# Patient Record
Sex: Female | Born: 1960
Health system: Southern US, Community
[De-identification: ages and names within clinical notes are randomized; demographics above are authoritative.]

## PROBLEM LIST (undated history)

## (undated) DIAGNOSIS — E785 Hyperlipidemia, unspecified: Secondary | ICD-10-CM

## (undated) HISTORY — DX: Hyperlipidemia, unspecified: E78.5

---

## 2003-11-24 HISTORY — PX: BREAST REDUCTION SURGERY: SHX8

## 2018-05-20 ENCOUNTER — Ambulatory Visit: Payer: Self-pay | Admitting: Family Medicine

## 2018-05-20 ENCOUNTER — Encounter: Payer: Self-pay | Admitting: Family Medicine

## 2018-05-20 VITALS — BP 92/62 | HR 67 | Temp 98.6°F | Ht 64.0 in | Wt 133.6 lb

## 2018-05-20 DIAGNOSIS — Z0000 Encounter for general adult medical examination without abnormal findings: Secondary | ICD-10-CM

## 2018-05-20 NOTE — Patient Instructions (Signed)

## 2018-05-20 NOTE — Progress Notes (Signed)
Mary Snyder is a 57 y.o. female who presents today with concerns of a need for an annual physical exam. She removed from [facility] needs PCP had historical episode of chest pain- CTA negative coronary clean per patient. She denies any chronic conditions or acute conditions that need to be addressed today.  Review of Systems  Constitutional: Negative for chills, fever and malaise/fatigue.  HENT: Negative for congestion, ear discharge, ear pain, sinus pain and sore throat.   Eyes: Negative.   Respiratory: Negative for cough, sputum production and shortness of breath.   Cardiovascular: Negative.  Negative for chest pain.  Gastrointestinal: Negative for abdominal pain, diarrhea, nausea and vomiting.  Genitourinary: Negative for dysuria, frequency, hematuria and urgency.  Musculoskeletal: Negative for myalgias.  Skin: Negative.   Neurological: Negative for headaches.  Endo/Heme/Allergies: Negative.   Psychiatric/Behavioral: Negative.     O: Vitals:   05/20/18 1135  BP: 92/62  Pulse: 67  Temp: 98.6 F (37 C)  SpO2: 98%     Physical Exam  Constitutional: She is oriented to person, place, and time. Vital signs are normal. She appears well-developed and well-nourished. She is active.  Non-toxic appearance. She does not have a sickly appearance.  HENT:  Head: Normocephalic.  Right Ear: Hearing, tympanic membrane, external ear and ear canal normal.  Left Ear: Hearing, tympanic membrane, external ear and ear canal normal.  Nose: Nose normal.  Mouth/Throat: Uvula is midline and oropharynx is clear and moist.  Neck: Normal range of motion. Neck supple.  Cardiovascular: Normal rate, regular rhythm, normal heart sounds and normal pulses.  Pulmonary/Chest: Effort normal and breath sounds normal.  Abdominal: Soft. Bowel sounds are normal.  Musculoskeletal: Normal range of motion.  Lymphadenopathy:       Head (right side): No submental and no submandibular adenopathy present.       Head  (left side): No submental and no submandibular adenopathy present.    She has no cervical adenopathy.  Neurological: She is alert and oriented to person, place, and time.  Psychiatric: She has a normal mood and affect. Her speech is normal and behavior is normal. Cognition and memory are normal.  PHQ-9 negative  Vitals reviewed.  A: 1. Physical exam    P: Exam findings, diagnosis etiology and medication use and indications reviewed with patient. Follow- Up and discharge instructions provided. No emergent/urgent issues found on exam.  Patient verbalized understanding of information provided and agrees with plan of care (POC), all questions answered.  1. Physical exam WNL

## 2018-05-25 DIAGNOSIS — H8111 Benign paroxysmal vertigo, right ear: Secondary | ICD-10-CM | POA: Diagnosis not present

## 2019-11-30 ENCOUNTER — Other Ambulatory Visit: Payer: Self-pay

## 2019-11-30 ENCOUNTER — Other Ambulatory Visit: Payer: 59 | Admitting: Internal Medicine

## 2019-11-30 DIAGNOSIS — Z1321 Encounter for screening for nutritional disorder: Secondary | ICD-10-CM | POA: Diagnosis not present

## 2019-11-30 DIAGNOSIS — Z1329 Encounter for screening for other suspected endocrine disorder: Secondary | ICD-10-CM

## 2019-11-30 DIAGNOSIS — Z0000 Encounter for general adult medical examination without abnormal findings: Secondary | ICD-10-CM | POA: Diagnosis not present

## 2019-11-30 DIAGNOSIS — Z13220 Encounter for screening for lipoid disorders: Secondary | ICD-10-CM

## 2019-12-01 LAB — CBC WITH DIFFERENTIAL/PLATELET
Absolute Monocytes: 299 cells/uL (ref 200–950)
Basophils Absolute: 41 cells/uL (ref 0–200)
Basophils Relative: 1 %
Eosinophils Absolute: 62 cells/uL (ref 15–500)
Eosinophils Relative: 1.5 %
HCT: 37.3 % (ref 35.0–45.0)
Hemoglobin: 12.4 g/dL (ref 11.7–15.5)
Lymphs Abs: 1132 cells/uL (ref 850–3900)
MCH: 30.6 pg (ref 27.0–33.0)
MCHC: 33.2 g/dL (ref 32.0–36.0)
MCV: 92.1 fL (ref 80.0–100.0)
MPV: 9.8 fL (ref 7.5–12.5)
Monocytes Relative: 7.3 %
Neutro Abs: 2567 cells/uL (ref 1500–7800)
Neutrophils Relative %: 62.6 %
Platelets: 260 10*3/uL (ref 140–400)
RBC: 4.05 10*6/uL (ref 3.80–5.10)
RDW: 12.2 % (ref 11.0–15.0)
Total Lymphocyte: 27.6 %
WBC: 4.1 10*3/uL (ref 3.8–10.8)

## 2019-12-01 LAB — COMPLETE METABOLIC PANEL WITH GFR
AG Ratio: 1.9 (calc) (ref 1.0–2.5)
ALT: 16 U/L (ref 6–29)
AST: 18 U/L (ref 10–35)
Albumin: 4.2 g/dL (ref 3.6–5.1)
Alkaline phosphatase (APISO): 61 U/L (ref 37–153)
BUN: 10 mg/dL (ref 7–25)
CO2: 28 mmol/L (ref 20–32)
Calcium: 9.5 mg/dL (ref 8.6–10.4)
Chloride: 106 mmol/L (ref 98–110)
Creat: 0.58 mg/dL (ref 0.50–1.05)
GFR, Est African American: 119 mL/min/{1.73_m2} (ref >=60)
GFR, Est Non African American: 102 mL/min/{1.73_m2} (ref >=60)
Globulin: 2.2 g/dL (calc) (ref 1.9–3.7)
Glucose, Bld: 100 mg/dL — ABNORMAL HIGH (ref 65–99)
Potassium: 4.4 mmol/L (ref 3.5–5.3)
Sodium: 140 mmol/L (ref 135–146)
Total Bilirubin: 0.5 mg/dL (ref 0.2–1.2)
Total Protein: 6.4 g/dL (ref 6.1–8.1)

## 2019-12-01 LAB — LIPID PANEL
Cholesterol: 257 mg/dL — ABNORMAL HIGH (ref <200)
HDL: 66 mg/dL (ref >=50)
LDL Cholesterol (Calc): 174 mg/dL (calc) — ABNORMAL HIGH
Non-HDL Cholesterol (Calc): 191 mg/dL (calc) — ABNORMAL HIGH (ref <130)
Total CHOL/HDL Ratio: 3.9 (calc) (ref <5.0)
Triglycerides: 71 mg/dL (ref <150)

## 2019-12-01 LAB — VITAMIN D 25 HYDROXY (VIT D DEFICIENCY, FRACTURES): Vit D, 25-Hydroxy: 17 ng/mL — ABNORMAL LOW (ref 30–100)

## 2019-12-01 LAB — TSH: TSH: 2.35 mIU/L (ref 0.40–4.50)

## 2019-12-05 ENCOUNTER — Ambulatory Visit (INDEPENDENT_AMBULATORY_CARE_PROVIDER_SITE_OTHER): Payer: 59 | Admitting: Internal Medicine

## 2019-12-05 ENCOUNTER — Encounter: Payer: Self-pay | Admitting: Internal Medicine

## 2019-12-05 ENCOUNTER — Other Ambulatory Visit: Payer: Self-pay

## 2019-12-05 VITALS — BP 110/70 | HR 58 | Temp 98.0°F | Ht 64.0 in | Wt 144.0 lb

## 2019-12-05 DIAGNOSIS — R739 Hyperglycemia, unspecified: Secondary | ICD-10-CM | POA: Diagnosis not present

## 2019-12-05 DIAGNOSIS — E7800 Pure hypercholesterolemia, unspecified: Secondary | ICD-10-CM | POA: Diagnosis not present

## 2019-12-05 DIAGNOSIS — Z1231 Encounter for screening mammogram for malignant neoplasm of breast: Secondary | ICD-10-CM | POA: Diagnosis not present

## 2019-12-05 DIAGNOSIS — Z0000 Encounter for general adult medical examination without abnormal findings: Secondary | ICD-10-CM | POA: Diagnosis not present

## 2019-12-05 DIAGNOSIS — E559 Vitamin D deficiency, unspecified: Secondary | ICD-10-CM | POA: Diagnosis not present

## 2019-12-05 DIAGNOSIS — R7302 Impaired glucose tolerance (oral): Secondary | ICD-10-CM

## 2019-12-05 LAB — POCT URINALYSIS DIPSTICK
Appearance: NEGATIVE
Bilirubin, UA: NEGATIVE
Blood, UA: NEGATIVE
Glucose, UA: NEGATIVE
Ketones, UA: NEGATIVE
Leukocytes, UA: NEGATIVE
Nitrite, UA: NEGATIVE
Odor: NEGATIVE
Protein, UA: NEGATIVE
Spec Grav, UA: 1.01 (ref 1.010–1.025)
Urobilinogen, UA: 0.2 E.U./dL
pH, UA: 6.5 (ref 5.0–8.0)

## 2019-12-05 MED ORDER — ROSUVASTATIN CALCIUM 5 MG PO TABS: ORAL_TABLET | ORAL | 1 refills | Status: DC

## 2019-12-05 MED ORDER — VITAMIN D (ERGOCALCIFEROL) 1.25 MG (50000 UNIT) PO CAPS: 50000.0000 [IU] | ORAL_CAPSULE | ORAL | 0 refills | Status: DC

## 2019-12-05 NOTE — Progress Notes (Signed)
   Subjective:    Patient ID: Mary Snyder, female    DOB: 19-Jan-1961, 59 y.o.   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  Patient's general health is excellent.   Had breast reduction surgery in 2005.  2 pregnancies no miscarriages.  No chronic medications.  Had 6 mm polyp removed from ascending colon on screening colonoscopy in December 2015 in [place] where she was living and working at that time.  I am not able to find pathology on care everywhere.    Family history: Father died at age 70 with history of congestive heart failure and heart disease.  Mother died at age 44 "all 7.  1 brother age 54 in good health.  One sister age 58 in good health.  2 sons ages 38 and 82 in good health but one has treated on allergy.  Menarche at 33.  Menopausal in 2016.  History of hyperlipidemia.  Currently not taking any chronic medications.  No known drug allergies.  No history of serious illnesses or accidents.  Social history: Married.  Does not smoke.  Social alcohol consumption.  Review of Systems no chest pain shortness of breath abdominal pain bowel issues or urinary issues.     Objective:   Physical Exam Blood pressure 110/70 pulse 58 temperature 98 degrees orally pulse oximetry 98% weight 144 pounds BMI 24.72.  Skin warm and dry.  Nodes none.  TMs are clear.  Neck is supple without JVD thyromegaly or adenopathy.  Chest clear to auscultation.  Cardiac exam regular rate and rhythm normal S1 and S2 without murmurs or gallops.  Abdomen soft nondistended without hepatosplenomegaly masses or tenderness.  No lower extremity edema or deformity.  Neuro no focal deficits.  Affect and judgment are normal.       Assessment & Plan:  Impaired glucose tolerance-hemoglobin A1c 6.1% continue diet and exercise efforts and follow-up in 6 months  Vitamin D  deficiency-needs high-dose vitamin D 50,000 units weekly for 3 to 6 months  Total cholesterol is 257 with an LDL of 174.  Recommend Crestor 5 mg 3 times a week and follow-up in 3 months.  History of colon polyp on colonoscopy and we need to determine the pathology of that polyp.  Plan: Recommend annual mammogram.  See her again in 3 months.

## 2019-12-08 ENCOUNTER — Other Ambulatory Visit: Payer: 59 | Admitting: Internal Medicine

## 2019-12-15 ENCOUNTER — Other Ambulatory Visit: Payer: 59 | Admitting: Internal Medicine

## 2019-12-15 ENCOUNTER — Other Ambulatory Visit: Payer: Self-pay

## 2019-12-15 DIAGNOSIS — R739 Hyperglycemia, unspecified: Secondary | ICD-10-CM | POA: Diagnosis not present

## 2019-12-16 LAB — HEMOGLOBIN A1C
Hgb A1c MFr Bld: 6.1 % of total Hgb — ABNORMAL HIGH (ref <5.7)
Mean Plasma Glucose: 128 (calc)
eAG (mmol/L): 7.1 (calc)

## 2019-12-24 NOTE — Patient Instructions (Signed)
It was a pleasure to see you today.  Take vitamin D supplement and start Crestor 5 mg 3 times a week.  Follow-up in 3 months.  We need to determine pathology of colon polyp that was removed in [place] in 2015.

## 2019-12-25 ENCOUNTER — Telehealth: Payer: Self-pay | Admitting: Internal Medicine

## 2019-12-25 NOTE — Telephone Encounter (Signed)
Faxed ROI to [employer] on 12/25/2019 for Medical Records, fax number [phone] and [phone]

## 2019-12-27 ENCOUNTER — Other Ambulatory Visit: Payer: Self-pay | Admitting: Internal Medicine

## 2020-03-07 ENCOUNTER — Other Ambulatory Visit: Payer: 59 | Admitting: Internal Medicine

## 2020-03-16 ENCOUNTER — Other Ambulatory Visit: Payer: Self-pay | Admitting: Internal Medicine

## 2020-03-19 ENCOUNTER — Other Ambulatory Visit: Payer: Self-pay

## 2020-03-19 ENCOUNTER — Telehealth: Payer: Self-pay | Admitting: Internal Medicine

## 2020-03-19 DIAGNOSIS — Z1329 Encounter for screening for other suspected endocrine disorder: Secondary | ICD-10-CM

## 2020-03-19 DIAGNOSIS — Z0000 Encounter for general adult medical examination without abnormal findings: Secondary | ICD-10-CM

## 2020-03-19 DIAGNOSIS — E559 Vitamin D deficiency, unspecified: Secondary | ICD-10-CM

## 2020-03-19 DIAGNOSIS — R7302 Impaired glucose tolerance (oral): Secondary | ICD-10-CM

## 2020-03-19 DIAGNOSIS — E7800 Pure hypercholesterolemia, unspecified: Secondary | ICD-10-CM

## 2020-03-19 NOTE — Addendum Note (Signed)
Addended by: Mady Haagensen on: 03/19/2020 12:20 PM   Modules accepted: Orders

## 2020-03-19 NOTE — Telephone Encounter (Signed)
Larna Daughters (Mackynzie's assistant) [phone]  Zigmund Daniel called to see if there was anyway that Pernell could get her fasting labs done at [facility], the lab is very close to her office and this would be more conventiant for her.

## 2020-05-02 ENCOUNTER — Other Ambulatory Visit (HOSPITAL_COMMUNITY)
Admission: RE | Admit: 2020-05-02 | Discharge: 2020-05-02 | Disposition: A | Discharge status: Home / Self Care | Payer: 59 | Source: Ambulatory Visit | Attending: Internal Medicine | Admitting: Internal Medicine

## 2020-05-02 DIAGNOSIS — E7800 Pure hypercholesterolemia, unspecified: Secondary | ICD-10-CM | POA: Insufficient documentation

## 2020-05-02 DIAGNOSIS — Z0000 Encounter for general adult medical examination without abnormal findings: Secondary | ICD-10-CM | POA: Insufficient documentation

## 2020-05-02 DIAGNOSIS — E559 Vitamin D deficiency, unspecified: Secondary | ICD-10-CM | POA: Diagnosis not present

## 2020-05-02 DIAGNOSIS — Z1329 Encounter for screening for other suspected endocrine disorder: Secondary | ICD-10-CM | POA: Insufficient documentation

## 2020-05-02 DIAGNOSIS — R7302 Impaired glucose tolerance (oral): Secondary | ICD-10-CM | POA: Diagnosis not present

## 2020-05-02 LAB — CBC WITH DIFFERENTIAL/PLATELET
Abs Immature Granulocytes: 0.01 10*3/uL (ref 0.00–0.07)
Basophils Absolute: 0 10*3/uL (ref 0.0–0.1)
Basophils Relative: 1 %
Eosinophils Absolute: 0.1 10*3/uL (ref 0.0–0.5)
Eosinophils Relative: 2 %
HCT: 40.3 % (ref 36.0–46.0)
Hemoglobin: 12.9 g/dL (ref 12.0–15.0)
Immature Granulocytes: 0 %
Lymphocytes Relative: 31 %
Lymphs Abs: 1.4 10*3/uL (ref 0.7–4.0)
MCH: 30.3 pg (ref 26.0–34.0)
MCHC: 32 g/dL (ref 30.0–36.0)
MCV: 94.6 fL (ref 80.0–100.0)
Monocytes Absolute: 0.3 10*3/uL (ref 0.1–1.0)
Monocytes Relative: 7 %
Neutro Abs: 2.7 10*3/uL (ref 1.7–7.7)
Neutrophils Relative %: 59 %
Platelets: 244 10*3/uL (ref 150–400)
RBC: 4.26 MIL/uL (ref 3.87–5.11)
RDW: 12.7 % (ref 11.5–15.5)
WBC: 4.5 10*3/uL (ref 4.0–10.5)
nRBC: 0 % (ref 0.0–0.2)

## 2020-05-02 LAB — HEMOGLOBIN A1C
Hgb A1c MFr Bld: 6.2 % — ABNORMAL HIGH (ref 4.8–5.6)
Mean Plasma Glucose: 131.24 mg/dL

## 2020-06-05 ENCOUNTER — Other Ambulatory Visit: Payer: Self-pay | Admitting: Internal Medicine

## 2020-06-05 NOTE — Telephone Encounter (Signed)
Attempted call, Patient was unavaliable

## 2020-06-05 NOTE — Telephone Encounter (Signed)
Please call Mary Snyder. It looks like she never got lipid panel liver functions drawn on Crestor 5 mg 3 times a week. Is she taking It? If so, needs lipid and liver. Would like to recheck Vitamin D level if she has been taking weekly supplement and I would like to see her at an office visit to discuss glucose intolerance. I would prefer she come her and get labs. We put in orders previously  and it never got done.

## 2020-07-04 ENCOUNTER — Other Ambulatory Visit: Payer: Self-pay

## 2020-07-04 ENCOUNTER — Other Ambulatory Visit: Payer: 59 | Admitting: Internal Medicine

## 2020-07-04 DIAGNOSIS — E7800 Pure hypercholesterolemia, unspecified: Secondary | ICD-10-CM

## 2020-07-04 DIAGNOSIS — E559 Vitamin D deficiency, unspecified: Secondary | ICD-10-CM

## 2020-07-04 DIAGNOSIS — R7302 Impaired glucose tolerance (oral): Secondary | ICD-10-CM | POA: Diagnosis not present

## 2020-07-05 ENCOUNTER — Encounter: Payer: Self-pay | Admitting: Internal Medicine

## 2020-07-05 ENCOUNTER — Other Ambulatory Visit: Payer: Self-pay

## 2020-07-05 ENCOUNTER — Ambulatory Visit: Payer: 59 | Admitting: Internal Medicine

## 2020-07-05 ENCOUNTER — Other Ambulatory Visit: Payer: Self-pay | Admitting: Internal Medicine

## 2020-07-05 VITALS — BP 110/80 | HR 55 | Wt 146.0 lb

## 2020-07-05 DIAGNOSIS — E559 Vitamin D deficiency, unspecified: Secondary | ICD-10-CM

## 2020-07-05 DIAGNOSIS — R7302 Impaired glucose tolerance (oral): Secondary | ICD-10-CM | POA: Diagnosis not present

## 2020-07-05 DIAGNOSIS — E7800 Pure hypercholesterolemia, unspecified: Secondary | ICD-10-CM | POA: Diagnosis not present

## 2020-07-05 LAB — LIPID PANEL
Cholesterol: 173 mg/dL (ref <200)
HDL: 61 mg/dL (ref >=50)
LDL Cholesterol (Calc): 99 mg/dL (calc)
Non-HDL Cholesterol (Calc): 112 mg/dL (calc) (ref <130)
Total CHOL/HDL Ratio: 2.8 (calc) (ref <5.0)
Triglycerides: 50 mg/dL (ref <150)

## 2020-07-05 LAB — HEPATIC FUNCTION PANEL
AG Ratio: 2.1 (calc) (ref 1.0–2.5)
ALT: 19 U/L (ref 6–29)
AST: 20 U/L (ref 10–35)
Albumin: 4.5 g/dL (ref 3.6–5.1)
Alkaline phosphatase (APISO): 69 U/L (ref 37–153)
Bilirubin, Direct: 0.1 mg/dL (ref 0.0–0.2)
Globulin: 2.1 g/dL (calc) (ref 1.9–3.7)
Indirect Bilirubin: 0.3 mg/dL (calc) (ref 0.2–1.2)
Total Bilirubin: 0.4 mg/dL (ref 0.2–1.2)
Total Protein: 6.6 g/dL (ref 6.1–8.1)

## 2020-07-05 LAB — HEMOGLOBIN A1C
Hgb A1c MFr Bld: 6.1 % of total Hgb — ABNORMAL HIGH (ref <5.7)
Mean Plasma Glucose: 128 (calc)
eAG (mmol/L): 7.1 (calc)

## 2020-07-05 LAB — VITAMIN D 25 HYDROXY (VIT D DEFICIENCY, FRACTURES): Vit D, 25-Hydroxy: 98 ng/mL (ref 30–100)

## 2020-07-05 MED ORDER — METFORMIN HCL ER 500 MG PO TB24: 500.0000 mg | ORAL_TABLET | Freq: Every day | ORAL | 1 refills | Status: DC

## 2020-07-05 MED ORDER — ROSUVASTATIN CALCIUM 5 MG PO TABS
5.0000 mg | ORAL_TABLET | Freq: Every day | ORAL | 3 refills | Status: DC
Start: 2020-07-05 — End: 2020-07-08

## 2020-07-05 NOTE — Progress Notes (Signed)
   Subjective:    Patient ID: Mary Snyder, female    DOB: 22-Nov-1961, 59 y.o.   MRN: [id number]  HPI 59 year old Female in today to follow-up on hyperlipidemia and impaired glucose tolerance.  She is now on Crestor 5 mg 3 times a week.  Recent lipid panel done on August 12 is completely normal.  Total cholesterol 173, HDL 61, triglycerides 50 and LDL 99.  In January total cholesterol was 257, HDL 66, triglycerides 71 and LDL 174.  In January her hemoglobin A1c was 6.1%.  In June it was 6.2%.  It is now 6.1%.  Her vitamin D level is excellent at 98.  It does not need to be higher.  She would like to stay on high-dose vitamin D weekly for now.  Her vitamin D level was 17 in January 2021.  Continue to hemoglobin A1c still being mildly abnormal, I would like for her to be on Metformin.  She is agreeable to this.  I have prescribed Glucophage XR 500 mg daily with follow-up in 3 months.  In the interim she has made significant lifestyle changes.  She is exercising regularly and watching her diet.    Review of Systems see above no new complaints    Objective:   Physical Exam Blood pressure 110/80 pulse 55 pulse oximetry 99%  Skin warm and dry.  Neck is supple without JVD thyromegaly or carotid bruits.  Chest is clear to auscultation.  Cardiac exam regular rate and rhythm normal S1 and S2.  Extremities without lower extremity edema.        Assessment & Plan:  She looks great and is taking care of herself with diet and exercise.  Vitamin D deficiency-we will continue high-dose vitamin D weekly  Impaired glucose tolerance-not much change in hemoglobin A1c.  Is now 6.1% and she will start Glucophage Exar 500 mg daily and follow-up in 3 months.  Hyperlipidemia-pure hypercholesterolemia.  She is on Crestor 5 mg 3 times a week and lipid panel and liver functions are normal.  Plan: Follow-up in 3 months with regard to elevated hemoglobin A1c at 6.1%.

## 2020-07-08 ENCOUNTER — Other Ambulatory Visit: Payer: Self-pay

## 2020-07-08 MED ORDER — ROSUVASTATIN CALCIUM 5 MG PO TABS: ORAL_TABLET | ORAL | 3 refills | Status: DC

## 2020-07-08 MED FILL — ROSUVASTATIN CALCIUM 5 MG T: 5 | 36 days supply | Qty: 36 | Fill #0

## 2020-07-14 NOTE — Patient Instructions (Signed)
It was a pleasure to see you today.  We are looking for tentorium.  Continue Crestor 5 mg 3 times a week.  Continue high-dose vitamin D supplement weekly.  We are adding Glucophage XR daily for glucose intolerance with follow-up in 3 months.

## 2020-09-08 ENCOUNTER — Other Ambulatory Visit: Payer: Self-pay | Admitting: Internal Medicine

## 2020-10-07 MED FILL — VIT D2 1.25 MG (50,000 UNIT: 1.25 MG | 84 days supply | Qty: 12 | Fill #0

## 2020-10-10 ENCOUNTER — Other Ambulatory Visit: Payer: Self-pay

## 2020-10-10 ENCOUNTER — Other Ambulatory Visit: Payer: 59 | Admitting: Internal Medicine

## 2020-10-10 DIAGNOSIS — R7302 Impaired glucose tolerance (oral): Secondary | ICD-10-CM | POA: Diagnosis not present

## 2020-10-10 DIAGNOSIS — E559 Vitamin D deficiency, unspecified: Secondary | ICD-10-CM

## 2020-10-10 DIAGNOSIS — E7800 Pure hypercholesterolemia, unspecified: Secondary | ICD-10-CM | POA: Diagnosis not present

## 2020-10-10 DIAGNOSIS — R739 Hyperglycemia, unspecified: Secondary | ICD-10-CM | POA: Diagnosis not present

## 2020-10-11 ENCOUNTER — Ambulatory Visit: Payer: 59 | Admitting: Internal Medicine

## 2020-10-11 LAB — HEPATIC FUNCTION PANEL
AG Ratio: 1.7 (calc) (ref 1.0–2.5)
ALT: 20 U/L (ref 6–29)
AST: 23 U/L (ref 10–35)
Albumin: 4.2 g/dL (ref 3.6–5.1)
Alkaline phosphatase (APISO): 65 U/L (ref 37–153)
Bilirubin, Direct: 0.1 mg/dL (ref 0.0–0.2)
Globulin: 2.5 g/dL (calc) (ref 1.9–3.7)
Indirect Bilirubin: 0.3 mg/dL (calc) (ref 0.2–1.2)
Total Bilirubin: 0.4 mg/dL (ref 0.2–1.2)
Total Protein: 6.7 g/dL (ref 6.1–8.1)

## 2020-10-11 LAB — LIPID PANEL
Cholesterol: 194 mg/dL (ref <200)
HDL: 73 mg/dL (ref >=50)
LDL Cholesterol (Calc): 108 mg/dL (calc) — ABNORMAL HIGH
Non-HDL Cholesterol (Calc): 121 mg/dL (calc) (ref <130)
Total CHOL/HDL Ratio: 2.7 (calc) (ref <5.0)
Triglycerides: 51 mg/dL (ref <150)

## 2020-10-11 LAB — VITAMIN D 25 HYDROXY (VIT D DEFICIENCY, FRACTURES): Vit D, 25-Hydroxy: 110 ng/mL — ABNORMAL HIGH (ref 30–100)

## 2020-10-11 LAB — HEMOGLOBIN A1C
Hgb A1c MFr Bld: 6 % of total Hgb — ABNORMAL HIGH (ref <5.7)
Mean Plasma Glucose: 126 (calc)
eAG (mmol/L): 7 (calc)

## 2020-10-11 LAB — MICROALBUMIN / CREATININE URINE RATIO
Creatinine, Urine: 95 mg/dL (ref 20–275)
Microalb Creat Ratio: 5 mcg/mg creat (ref <30)
Microalb, Ur: 0.5 mg/dL

## 2020-11-01 ENCOUNTER — Other Ambulatory Visit: Payer: Self-pay

## 2020-11-01 ENCOUNTER — Ambulatory Visit (INDEPENDENT_AMBULATORY_CARE_PROVIDER_SITE_OTHER): Payer: 59 | Admitting: Internal Medicine

## 2020-11-01 ENCOUNTER — Encounter: Payer: Self-pay | Admitting: Internal Medicine

## 2020-11-01 VITALS — BP 100/70 | HR 74 | Temp 98.6°F | Ht 64.0 in | Wt 119.0 lb

## 2020-11-01 DIAGNOSIS — R059 Cough, unspecified: Secondary | ICD-10-CM | POA: Diagnosis not present

## 2020-11-01 DIAGNOSIS — J22 Unspecified acute lower respiratory infection: Secondary | ICD-10-CM

## 2020-11-01 MED ORDER — AZITHROMYCIN 250 MG PO TABS
ORAL_TABLET | ORAL | 0 refills | Status: DC
Start: 2020-11-01 — End: 2021-06-20

## 2020-11-01 NOTE — Progress Notes (Signed)
   Subjective:    Patient ID: Mary Snyder, female    DOB: May 21, 1961, 59 y.o.   MRN: [id number]  HPI 59 year old Female seen for a cough.  Was in [place] this past weekend and it was very cold.  History of vitamin D deficiency.  History of hyperlipidemia treated with Crestor 5 mg 3 times a week and stable.  Has no fever or shaking chills- just a cough.  Does not COVID-19 exposure other than attending football game in [place].  Has had 2 COVID-19 vaccines.  Review of Systems no fever, chills, dysgeusia, nausea vomiting or myalgias     Objective:   Physical Exam Blood pressure 100/70, pulse 74 regular temperature 98.6 degrees pulse oximetry 97% weight 119 pounds height 5 feet 4 inches  Skin warm and dry: TMs are clear.  Pharynx is clear.  Neck supple.  Chest clear.  No rales or wheezing appreciated.       Assessment & Plan:  Acute lower respiratory infection  History of vitamin D deficiency treated with high-dose vitamin D  History of hyperlipidemia treated with Crestor  Plan: Zithromax Z-PAK 2 p.o. day 1 followed by 1 p.o. days 2 through 5.  Rest and drink plenty of fluids.

## 2020-11-04 LAB — RESPIRATORY VIRUS PANEL

## 2020-12-11 NOTE — Patient Instructions (Signed)
Take Zithromax Z-PAK 2 p.o. day 1 followed by 1 p.o. days 2 through 5.  Rest and drink plenty of fluids.

## 2020-12-25 MED FILL — VIT D2 1.25 MG (50,000 UNIT: 1.25 MG | 84 days supply | Qty: 12 | Fill #0

## 2021-01-24 ENCOUNTER — Other Ambulatory Visit: Payer: 59 | Admitting: Internal Medicine

## 2021-01-24 DIAGNOSIS — Z0000 Encounter for general adult medical examination without abnormal findings: Secondary | ICD-10-CM

## 2021-01-24 DIAGNOSIS — Z1329 Encounter for screening for other suspected endocrine disorder: Secondary | ICD-10-CM

## 2021-01-24 DIAGNOSIS — E559 Vitamin D deficiency, unspecified: Secondary | ICD-10-CM

## 2021-01-24 DIAGNOSIS — R7302 Impaired glucose tolerance (oral): Secondary | ICD-10-CM

## 2021-01-24 DIAGNOSIS — E7800 Pure hypercholesterolemia, unspecified: Secondary | ICD-10-CM

## 2021-02-06 DIAGNOSIS — L814 Other melanin hyperpigmentation: Secondary | ICD-10-CM | POA: Diagnosis not present

## 2021-02-06 DIAGNOSIS — D485 Neoplasm of uncertain behavior of skin: Secondary | ICD-10-CM | POA: Diagnosis not present

## 2021-02-06 DIAGNOSIS — D229 Melanocytic nevi, unspecified: Secondary | ICD-10-CM | POA: Diagnosis not present

## 2021-02-06 DIAGNOSIS — D1801 Hemangioma of skin and subcutaneous tissue: Secondary | ICD-10-CM | POA: Diagnosis not present

## 2021-02-07 ENCOUNTER — Encounter: Payer: 59 | Admitting: Internal Medicine

## 2021-02-13 MED FILL — ROSUVASTATIN CALCIUM 5 MG T: 5 | 84 days supply | Qty: 36 | Fill #0

## 2021-02-20 ENCOUNTER — Other Ambulatory Visit: Payer: Self-pay

## 2021-02-20 ENCOUNTER — Other Ambulatory Visit: Payer: 59 | Admitting: Internal Medicine

## 2021-02-20 DIAGNOSIS — E559 Vitamin D deficiency, unspecified: Secondary | ICD-10-CM | POA: Diagnosis not present

## 2021-02-20 DIAGNOSIS — Z0000 Encounter for general adult medical examination without abnormal findings: Secondary | ICD-10-CM | POA: Diagnosis not present

## 2021-02-20 DIAGNOSIS — Z1329 Encounter for screening for other suspected endocrine disorder: Secondary | ICD-10-CM | POA: Diagnosis not present

## 2021-02-20 DIAGNOSIS — E7800 Pure hypercholesterolemia, unspecified: Secondary | ICD-10-CM | POA: Diagnosis not present

## 2021-02-20 DIAGNOSIS — R7302 Impaired glucose tolerance (oral): Secondary | ICD-10-CM | POA: Diagnosis not present

## 2021-02-21 LAB — COMPLETE METABOLIC PANEL WITH GFR
AG Ratio: 2.1 (calc) (ref 1.0–2.5)
ALT: 23 U/L (ref 6–29)
AST: 23 U/L (ref 10–35)
Albumin: 4.6 g/dL (ref 3.6–5.1)
Alkaline phosphatase (APISO): 67 U/L (ref 37–153)
BUN: 14 mg/dL (ref 7–25)
CO2: 30 mmol/L (ref 20–32)
Calcium: 9.3 mg/dL (ref 8.6–10.4)
Chloride: 107 mmol/L (ref 98–110)
Creat: 0.55 mg/dL (ref 0.50–1.05)
GFR, Est African American: 120 mL/min/{1.73_m2} (ref >=60)
GFR, Est Non African American: 103 mL/min/{1.73_m2} (ref >=60)
Globulin: 2.2 g/dL (calc) (ref 1.9–3.7)
Glucose, Bld: 94 mg/dL (ref 65–99)
Potassium: 4.8 mmol/L (ref 3.5–5.3)
Sodium: 142 mmol/L (ref 135–146)
Total Bilirubin: 0.5 mg/dL (ref 0.2–1.2)
Total Protein: 6.8 g/dL (ref 6.1–8.1)

## 2021-02-21 LAB — HEMOGLOBIN A1C
Hgb A1c MFr Bld: 5.7 % of total Hgb — ABNORMAL HIGH (ref <5.7)
Mean Plasma Glucose: 117 mg/dL
eAG (mmol/L): 6.5 mmol/L

## 2021-02-21 LAB — LIPID PANEL
Cholesterol: 209 mg/dL — ABNORMAL HIGH (ref <200)
HDL: 81 mg/dL (ref >=50)
LDL Cholesterol (Calc): 114 mg/dL (calc) — ABNORMAL HIGH
Non-HDL Cholesterol (Calc): 128 mg/dL (calc) (ref <130)
Total CHOL/HDL Ratio: 2.6 (calc) (ref <5.0)
Triglycerides: 47 mg/dL (ref <150)

## 2021-02-21 LAB — CBC WITH DIFFERENTIAL/PLATELET
Absolute Monocytes: 229 cells/uL (ref 200–950)
Basophils Absolute: 41 cells/uL (ref 0–200)
Basophils Relative: 1.1 %
Eosinophils Absolute: 48 cells/uL (ref 15–500)
Eosinophils Relative: 1.3 %
HCT: 38.9 % (ref 35.0–45.0)
Hemoglobin: 12.6 g/dL (ref 11.7–15.5)
Lymphs Abs: 1262 cells/uL (ref 850–3900)
MCH: 31 pg (ref 27.0–33.0)
MCHC: 32.4 g/dL (ref 32.0–36.0)
MCV: 95.6 fL (ref 80.0–100.0)
MPV: 9.6 fL (ref 7.5–12.5)
Monocytes Relative: 6.2 %
Neutro Abs: 2120 cells/uL (ref 1500–7800)
Neutrophils Relative %: 57.3 %
Platelets: 234 10*3/uL (ref 140–400)
RBC: 4.07 10*6/uL (ref 3.80–5.10)
RDW: 11.9 % (ref 11.0–15.0)
Total Lymphocyte: 34.1 %
WBC: 3.7 10*3/uL — ABNORMAL LOW (ref 3.8–10.8)

## 2021-02-21 LAB — VITAMIN D 25 HYDROXY (VIT D DEFICIENCY, FRACTURES): Vit D, 25-Hydroxy: 84 ng/mL (ref 30–100)

## 2021-02-21 LAB — TSH: TSH: 2.65 mIU/L (ref 0.40–4.50)

## 2021-02-22 ENCOUNTER — Other Ambulatory Visit (HOSPITAL_COMMUNITY): Payer: Self-pay

## 2021-02-25 ENCOUNTER — Encounter: Payer: 59 | Admitting: Internal Medicine

## 2021-03-03 ENCOUNTER — Other Ambulatory Visit: Payer: Self-pay | Admitting: Internal Medicine

## 2021-03-03 ENCOUNTER — Other Ambulatory Visit (HOSPITAL_COMMUNITY): Payer: Self-pay

## 2021-03-03 MED ORDER — VITAMIN D (ERGOCALCIFEROL) 1.25 MG (50000 UNIT) PO CAPS
50000.0000 [IU] | ORAL_CAPSULE | ORAL | 0 refills | Status: DC
  Filled 2021-03-03: qty 12, 84d supply, fill #0

## 2021-03-04 ENCOUNTER — Other Ambulatory Visit (HOSPITAL_COMMUNITY): Payer: Self-pay

## 2021-03-13 ENCOUNTER — Other Ambulatory Visit (HOSPITAL_COMMUNITY): Payer: Self-pay

## 2021-05-22 ENCOUNTER — Other Ambulatory Visit (HOSPITAL_COMMUNITY): Payer: Self-pay

## 2021-05-22 MED ORDER — ERYTHROMYCIN 5 MG/GM OP OINT
1.0000 "application " | TOPICAL_OINTMENT | Freq: Four times a day (QID) | OPHTHALMIC | 0 refills | Status: DC
  Filled 2021-05-22: qty 3.5, 10d supply, fill #0

## 2021-06-20 ENCOUNTER — Encounter: Payer: Self-pay | Admitting: Internal Medicine

## 2021-06-20 ENCOUNTER — Other Ambulatory Visit: Payer: Self-pay

## 2021-06-20 ENCOUNTER — Other Ambulatory Visit (HOSPITAL_COMMUNITY): Payer: Self-pay

## 2021-06-20 ENCOUNTER — Ambulatory Visit (INDEPENDENT_AMBULATORY_CARE_PROVIDER_SITE_OTHER): Payer: 59 | Admitting: Internal Medicine

## 2021-06-20 VITALS — BP 90/60 | HR 60 | Ht 64.0 in | Wt 123.0 lb

## 2021-06-20 DIAGNOSIS — R7302 Impaired glucose tolerance (oral): Secondary | ICD-10-CM

## 2021-06-20 DIAGNOSIS — E7800 Pure hypercholesterolemia, unspecified: Secondary | ICD-10-CM | POA: Diagnosis not present

## 2021-06-20 DIAGNOSIS — Z0000 Encounter for general adult medical examination without abnormal findings: Secondary | ICD-10-CM

## 2021-06-20 DIAGNOSIS — K635 Polyp of colon: Secondary | ICD-10-CM

## 2021-06-20 DIAGNOSIS — Z8639 Personal history of other endocrine, nutritional and metabolic disease: Secondary | ICD-10-CM

## 2021-06-20 LAB — POCT URINALYSIS DIPSTICK
Appearance: NEGATIVE
Bilirubin, UA: NEGATIVE
Blood, UA: NEGATIVE
Glucose, UA: NEGATIVE
Ketones, UA: NEGATIVE
Leukocytes, UA: NEGATIVE
Nitrite, UA: NEGATIVE
Odor: NEGATIVE
Protein, UA: NEGATIVE
Spec Grav, UA: 1.01 (ref 1.010–1.025)
Urobilinogen, UA: 0.2 E.U./dL
pH, UA: 6.5 (ref 5.0–8.0)

## 2021-06-20 MED ORDER — ROSUVASTATIN CALCIUM 5 MG PO TABS
ORAL_TABLET | ORAL | 3 refills | Status: DC
  Filled 2021-06-20 – 2021-09-10 (×3): qty 36, 84d supply, fill #0

## 2021-06-20 MED ORDER — VITAMIN D (ERGOCALCIFEROL) 1.25 MG (50000 UNIT) PO CAPS
50000.0000 [IU] | ORAL_CAPSULE | ORAL | 0 refills | Status: DC
  Filled 2021-06-20 – 2021-09-10 (×3): qty 12, 84d supply, fill #0

## 2021-06-20 NOTE — Progress Notes (Signed)
Subjective:    Patient ID: Mary Snyder, female    DOB: 1961-04-28, 60 y.o.   MRN: [id number]  HPI Pleasant 60 year old Female seen today for health maintenance exam  Patient had screening colonoscopy in 2015 in [place] with 10-year follow-up recommended.  Next colonoscopy due 2025.  Have ordered screening mammogram.  Records indicate 2 COVID vaccinations.  Recommend booster in the Fall.  Tetanus immunization is up-to-date.  Gets annual flu vaccine through employment.  She has a history of impaired glucose tolerance and hemoglobin A1c is now excellent at 5.7%.  Hemoglobin A1c has been as high as 6.2% in June 2021.  She is exercising regularly and watching her diet.  She is not on medication for glucose intolerance.  However, lipids have increased slightly.  Total cholesterol in November was 191 and is now 209.  LDL cholesterol was 108 and is now 114.  In August 2021 her lipids were normal.  In January 2021 total cholesterol was 257 with LDL cholesterol of 174.  Was congratulated on diet and exercise efforts.  She will take Crestor 5 mg 3 times a week.  She will follow-up in February with lipid panel, liver functions and hemoglobin A1C.  History of vitamin D deficiency treated with high-dose vitamin D.  This will be continued.  Social history: She is married.  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  Does not smoke.  Social alcohol consumption.  She has 2 sons.  Menarche at 69.  Menopausal in 2016.  Records from [facility] indicate she has a past history of endometriosis determined by laparoscopy x3.  History of reduction mammoplasty.  Family history: Breast cancer in mother.  Heart disease in brother.  Macular degeneration in father.  Had GYN exam in [place] in 2018. This included Pap smear.  Had colonoscopy in [place] in 2015 with small flat polyp removed which proved to be hyperplastic polyp.Follow up recommended 2025.      Review of  Systems no new complaints     Objective:   Physical Exam Blood pressure 90/60 pulse 60 regular pulse oximetry 97% weight 123 pounds height 5 feet 4 inches BMI 21.11  Skin: Warm and dry.  No cervical adenopathy.  No thyromegaly.  No carotid bruits.  Chest is clear to auscultation.  Cardiac exam: Regular rate and rhythm.  No ectopy appreciated.  Abdomen soft nondistended without hepatosplenomegaly masses or tenderness.  Pelvic exam deferred to GYN physician.  No lower extremity pitting edema.  Neurological exam is intact without focal deficits.  Affect thought and judgment are normal.       Assessment & Plan:  History of hyperplastic polyp on colonoscopy 2015 with 10-year follow-up recommended  History of vitamin D deficiency-now on high-dose vitamin D weekly.  Level was 17 in January 2021  Pure hypercholesterolemia treated with Crestor 5 mg 3 times a week.  Total cholesterol was 209 in March 2022 and was 194 in November 2021.  LDL cholesterol is 114 and was 108 in November 2021.  This is significantly improved from January 2021 when total cholesterol was 257 and LDL cholesterol was 174.  Diet exercise has helped quite a bit as well.  Impaired glucose tolerance-treated with diet.  Hemoglobin A1c has improved from 6.2 in June 20 21-5.7 in March 2022.  She is not on any medication for glucose intolerance and has been exercising and watching her diet.  Plan: We will have her return in 6 months for  lipid panel liver functions and hemoglobin A1c.

## 2021-06-23 ENCOUNTER — Telehealth: Payer: Self-pay | Admitting: Internal Medicine

## 2021-06-23 NOTE — Telephone Encounter (Signed)
[name] [phone]  Shahad called to say that Dr Sabra Heck is not accepting new patients any more, do you recommend anyone else?

## 2021-06-23 NOTE — Telephone Encounter (Signed)
Called patient and gave name and number, put referral in.

## 2021-06-27 ENCOUNTER — Ambulatory Visit: Payer: 59

## 2021-06-27 ENCOUNTER — Other Ambulatory Visit (HOSPITAL_BASED_OUTPATIENT_CLINIC_OR_DEPARTMENT_OTHER): Payer: Self-pay

## 2021-06-30 ENCOUNTER — Other Ambulatory Visit (HOSPITAL_COMMUNITY): Payer: Self-pay

## 2021-07-22 NOTE — Patient Instructions (Addendum)
Please return in 6 months for lipid panel, liver functions and hemoglobin A1c.  Continue high-dose vitamin D weekly.  Continue low-dose Crestor 5 mg 3 times a week.  Glucose intolerance is controlled with diet.  Recommend COVID booster in the Fall.  Mammogram has been ordered.

## 2021-08-15 ENCOUNTER — Other Ambulatory Visit (HOSPITAL_COMMUNITY): Payer: Self-pay

## 2021-08-25 ENCOUNTER — Other Ambulatory Visit (HOSPITAL_COMMUNITY): Payer: Self-pay

## 2021-09-03 ENCOUNTER — Telehealth: Payer: Self-pay

## 2021-09-03 NOTE — Telephone Encounter (Signed)
Patient is scheduled for November 3 at 5 pm.

## 2021-09-03 NOTE — Telephone Encounter (Signed)
-----   Message from Elby Showers, MD sent at 09/02/2021 10:39 AM EDT ----- Please let her know we are extending the order but if she has had a mammogram this year elsewhere then please let us know. ----- Message ----- From: SYSTEM Sent: 09/02/2021  12:16 AM EDT To: Elby Showers, MD

## 2021-09-10 ENCOUNTER — Encounter (HOSPITAL_BASED_OUTPATIENT_CLINIC_OR_DEPARTMENT_OTHER): Payer: Self-pay | Admitting: Obstetrics & Gynecology

## 2021-09-10 ENCOUNTER — Other Ambulatory Visit: Payer: Self-pay

## 2021-09-10 ENCOUNTER — Other Ambulatory Visit (HOSPITAL_COMMUNITY): Payer: Self-pay

## 2021-09-10 ENCOUNTER — Other Ambulatory Visit (HOSPITAL_COMMUNITY)
Admission: RE | Admit: 2021-09-10 | Discharge: 2021-09-10 | Disposition: A | Discharge status: Home / Self Care | Payer: 59 | Source: Ambulatory Visit | Attending: Obstetrics & Gynecology | Admitting: Obstetrics & Gynecology

## 2021-09-10 ENCOUNTER — Ambulatory Visit (INDEPENDENT_AMBULATORY_CARE_PROVIDER_SITE_OTHER): Payer: 59 | Admitting: Obstetrics & Gynecology

## 2021-09-10 VITALS — BP 121/51 | HR 54 | Ht 63.0 in | Wt 126.4 lb

## 2021-09-10 DIAGNOSIS — E785 Hyperlipidemia, unspecified: Secondary | ICD-10-CM

## 2021-09-10 DIAGNOSIS — Z124 Encounter for screening for malignant neoplasm of cervix: Secondary | ICD-10-CM | POA: Insufficient documentation

## 2021-09-10 DIAGNOSIS — Z780 Asymptomatic menopausal state: Secondary | ICD-10-CM

## 2021-09-10 DIAGNOSIS — Z01419 Encounter for gynecological examination (general) (routine) without abnormal findings: Secondary | ICD-10-CM

## 2021-09-10 NOTE — Progress Notes (Signed)
60 y.o. G2P2 Married White or Caucasian female here for annual exam.  PMP.  No HRT.  Employed by [employer].  PCP:  Dr. Renold Genta  No LMP recorded. Patient is postmenopausal.          Sexually active: Yes.    The current method of family planning is status postmenopausal.    Smoker:  no  Health Maintenance: Pap:  12/23/16 neg History of abnormal Pap:  no MMG:  scheduled for 09/2021.   Colonoscopy:  10/27/14 BMD:   discussed with pt Screening Labs: done 01/2021 with Dr. Renold Genta. Reviewed.   reports that she has never smoked. She has never used smokeless tobacco. She reports current alcohol use. She reports that she does not use drugs.  Past Medical History:  Diagnosis Date   Hyperlipidemia     Past Surgical History:  Procedure Laterality Date   BREAST REDUCTION SURGERY  2005    Current Outpatient Medications  Medication Sig Dispense Refill   rosuvastatin (CRESTOR) 5 MG tablet TAKE ONE TABLET BY MOUTH THREE TIMES A WEEK 36 tablet 3   Vitamin D, Ergocalciferol, (DRISDOL) 1.25 MG (50000 UNIT) CAPS capsule Take 1 capsule (50,000 Units total) by mouth once every 7 days 12 capsule 0   No current facility-administered medications for this visit.    Family History  Problem Relation Age of Onset   Hypertension Father    Heart attack Father    Arthritis Mother    Hypertension Mother    Heart attack Brother    Kidney Stones Brother     Review of Systems  All other systems reviewed and are negative.  Exam:   BP (!) 121/51   Pulse (!) 54   Ht 5\' 3"  (1.6 m)   Wt 126 lb 6.4 oz (57.3 kg)   BMI 22.39 kg/m   Height: 5\' 3"  (160 cm)  General appearance: alert, cooperative and appears stated age Head: Normocephalic, without obvious abnormality, atraumatic Neck: no adenopathy, supple, symmetrical, trachea midline and thyroid normal to inspection and palpation Lungs: clear to auscultation bilaterally Breasts: normal appearance, no masses or tenderness Heart: regular rate and  rhythm Abdomen: soft, non-tender; bowel sounds normal; no masses,  no organomegaly Extremities: extremities normal, atraumatic, no cyanosis or edema Skin: Skin color, texture, turgor normal. No rashes or lesions Lymph nodes: Cervical, supraclavicular, and axillary nodes normal. No abnormal inguinal nodes palpated Neurologic: Grossly normal  Pelvic: External genitalia:  no lesions              Urethra:  normal appearing urethra with no masses, tenderness or lesions              Bartholins and Skenes: normal                 Vagina: normal appearing vagina with normal color and no discharge, no lesions              Cervix: no lesions              Pap taken: Yes.   Bimanual Exam:  Uterus:  normal size, contour, position, consistency, mobility, non-tender              Adnexa: normal adnexa and no mass, fullness, tenderness               Rectovaginal: Confirms               Anus:  normal sphincter tone, no lesions  Chaperone, Octaviano Batty, CMA, was present for exam.  Assessment/Plan: 1. Well woman exam with routine gynecological exam - pap and HR HPV obtained todayPap:  12/23/16 neg - MMG scheduled 09/2021 - colonoscopy 10/27/2014.  In Care Everywhere.  Follow up 10 years. - plan BMD after next birthday - lab work done with Dr. Renold Genta - Care Gaps reviewed and updated  2. Postmenopausal - no HRT  3. Hyperlipidemia, unspecified hyperlipidemia type - on Crestor

## 2021-09-12 LAB — CYTOLOGY - PAP
Comment: NEGATIVE
Diagnosis: NEGATIVE
High risk HPV: NEGATIVE

## 2021-09-13 DIAGNOSIS — E785 Hyperlipidemia, unspecified: Secondary | ICD-10-CM | POA: Insufficient documentation

## 2021-09-13 DIAGNOSIS — Z780 Asymptomatic menopausal state: Secondary | ICD-10-CM | POA: Insufficient documentation

## 2021-09-25 ENCOUNTER — Ambulatory Visit
Admission: RE | Admit: 2021-09-25 | Discharge: 2021-09-25 | Disposition: A | Discharge status: Home / Self Care | Payer: 59 | Source: Ambulatory Visit | Attending: Internal Medicine | Admitting: Internal Medicine

## 2021-09-25 DIAGNOSIS — Z1231 Encounter for screening mammogram for malignant neoplasm of breast: Secondary | ICD-10-CM | POA: Diagnosis not present

## 2021-12-19 ENCOUNTER — Other Ambulatory Visit: Payer: Self-pay

## 2021-12-19 ENCOUNTER — Other Ambulatory Visit: Payer: 59 | Admitting: Internal Medicine

## 2021-12-19 DIAGNOSIS — R7302 Impaired glucose tolerance (oral): Secondary | ICD-10-CM | POA: Diagnosis not present

## 2021-12-19 DIAGNOSIS — E7800 Pure hypercholesterolemia, unspecified: Secondary | ICD-10-CM

## 2021-12-20 LAB — LIPID PANEL
Cholesterol: 222 mg/dL — ABNORMAL HIGH (ref <200)
HDL: 71 mg/dL (ref >=50)
LDL Cholesterol (Calc): 137 mg/dL (calc) — ABNORMAL HIGH
Non-HDL Cholesterol (Calc): 151 mg/dL (calc) — ABNORMAL HIGH (ref <130)
Total CHOL/HDL Ratio: 3.1 (calc) (ref <5.0)
Triglycerides: 52 mg/dL (ref <150)

## 2021-12-20 LAB — HEMOGLOBIN A1C
Hgb A1c MFr Bld: 6 % of total Hgb — ABNORMAL HIGH (ref <5.7)
Mean Plasma Glucose: 126 mg/dL
eAG (mmol/L): 7 mmol/L

## 2021-12-20 LAB — HEPATIC FUNCTION PANEL
AG Ratio: 1.9 (calc) (ref 1.0–2.5)
ALT: 19 U/L (ref 6–29)
AST: 20 U/L (ref 10–35)
Albumin: 4.2 g/dL (ref 3.6–5.1)
Alkaline phosphatase (APISO): 63 U/L (ref 37–153)
Bilirubin, Direct: 0.1 mg/dL (ref 0.0–0.2)
Globulin: 2.2 g/dL (calc) (ref 1.9–3.7)
Indirect Bilirubin: 0.3 mg/dL (calc) (ref 0.2–1.2)
Total Bilirubin: 0.4 mg/dL (ref 0.2–1.2)
Total Protein: 6.4 g/dL (ref 6.1–8.1)

## 2021-12-26 ENCOUNTER — Other Ambulatory Visit (HOSPITAL_COMMUNITY): Payer: Self-pay

## 2021-12-26 ENCOUNTER — Other Ambulatory Visit: Payer: Self-pay

## 2021-12-26 ENCOUNTER — Ambulatory Visit: Payer: 59 | Admitting: Internal Medicine

## 2021-12-26 ENCOUNTER — Encounter: Payer: Self-pay | Admitting: Internal Medicine

## 2021-12-26 VITALS — BP 100/60 | HR 63 | Temp 97.4°F | Ht 63.0 in | Wt 128.5 lb

## 2021-12-26 DIAGNOSIS — E7800 Pure hypercholesterolemia, unspecified: Secondary | ICD-10-CM

## 2021-12-26 DIAGNOSIS — Z8639 Personal history of other endocrine, nutritional and metabolic disease: Secondary | ICD-10-CM

## 2021-12-26 DIAGNOSIS — M7918 Myalgia, other site: Secondary | ICD-10-CM | POA: Diagnosis not present

## 2021-12-26 DIAGNOSIS — R7302 Impaired glucose tolerance (oral): Secondary | ICD-10-CM | POA: Diagnosis not present

## 2021-12-26 MED ORDER — MELOXICAM 15 MG PO TABS
15.0000 mg | ORAL_TABLET | Freq: Every day | ORAL | 0 refills | Status: DC
  Filled 2021-12-26: qty 30, 30d supply, fill #0

## 2021-12-26 MED ORDER — ROSUVASTATIN CALCIUM 10 MG PO TABS
10.0000 mg | ORAL_TABLET | Freq: Every day | ORAL | 1 refills | Status: DC
  Filled 2021-12-26: qty 90, 90d supply, fill #0
  Filled 2022-12-13 – 2022-12-22 (×2): qty 90, 90d supply, fill #1

## 2021-12-26 NOTE — Progress Notes (Signed)
° °  Subjective:    Patient ID: Mary Snyder, female    DOB: 1960/12/25, 61 y.o.   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  Lipid panel on Crestor 5 mg 3 times a week has not shown significant improvement.  Total cholesterol in March was 209 and is now 222.  LDL cholesterol 114 and is now 137.  She will begin Crestor 10 mg daily and follow-up in 3 months.  She is on high-dose vitamin D weekly for history of vitamin D deficiency.  This will be continued.  She is having some musculoskeletal pain with working out and Mobic 15 mg daily has been prescribed.  Otherwise she feels well.    Review of Systems no new complaints     Objective:   Physical Exam Blood pressure excellent 100/60 pulse 63 pulse oximetry 97% weight 128 pounds 8 ounces.  BMI 22.76 weight 128 pounds 8 ounces (gained 5.5 pounds since July 2022 but this is just after the holidays) Skin warm and dry.  No cervical adenopathy.  Chest clear.  Cardiac exam regular rate and rhythm.  No lower extremity edema.      Assessment & Plan:   Impaired glucose tolerance-probably would benefit from Endocrinology referral as hemoglobin A1c is 6% and was 5.7% in March 2022.  Was also running around 6 to 6.2% in 2021.  She would probably welcome being on Wegovy if that is appropriate for her.  History of vitamin D deficiency-continue high-dose vitamin D weekly  Hyperlipidemia increase Crestor to 10 mg daily and follow-up in 3 months.  CPE will be due at that time.  Musculoskeletal pain-meloxicam prescribed 15 mg daily as needed.

## 2021-12-31 ENCOUNTER — Other Ambulatory Visit (HOSPITAL_COMMUNITY): Payer: Self-pay

## 2022-01-11 ENCOUNTER — Encounter: Payer: Self-pay | Admitting: Internal Medicine

## 2022-01-11 NOTE — Patient Instructions (Addendum)
Continue high-dose vitamin D weekly.  Change Crestor to 10 mg daily and follow-up in May.  Consider Endocrine referral for elevated hemoglobin A1c which is mild.  Meloxicam prescribed for musculoskeletal pain.

## 2022-01-28 ENCOUNTER — Telehealth: Payer: Self-pay | Admitting: Internal Medicine

## 2022-01-28 NOTE — Telephone Encounter (Signed)
?  Theresia Majors ?[phone] ? ?Mary Snyder called to say she has COVID, started getting sick on Monday, sore throat, runny nose, cough, body aches's and head ache. She stated feel like really bad flu. I scheduled for Video visit tomorrow at 11:45. ? ?She has no blood pressure or thermometer in her home will try to get at least a thermometer before visit. ? ?

## 2022-01-29 ENCOUNTER — Telehealth: Payer: 59 | Admitting: Internal Medicine

## 2022-01-29 ENCOUNTER — Other Ambulatory Visit: Payer: Self-pay

## 2022-01-29 ENCOUNTER — Other Ambulatory Visit (HOSPITAL_COMMUNITY): Payer: Self-pay

## 2022-01-29 ENCOUNTER — Encounter: Payer: Self-pay | Admitting: Internal Medicine

## 2022-01-29 ENCOUNTER — Ambulatory Visit: Payer: 59 | Admitting: Internal Medicine

## 2022-01-29 VITALS — Temp 99.0°F

## 2022-01-29 DIAGNOSIS — U071 COVID-19: Secondary | ICD-10-CM

## 2022-01-29 MED ORDER — BENZONATATE 100 MG PO CAPS
100.0000 mg | ORAL_CAPSULE | Freq: Three times a day (TID) | ORAL | 0 refills | Status: DC | PRN
  Filled 2022-01-29: qty 30, 10d supply, fill #0

## 2022-01-29 MED ORDER — NIRMATRELVIR&RITONAVIR 300/100 20 X 150 MG & 10 X 100MG PO TBPK
3.0000 | ORAL_TABLET | Freq: Two times a day (BID) | ORAL | 0 refills | Status: AC
  Filled 2022-01-29: qty 30, 5d supply, fill #0

## 2022-01-29 NOTE — Progress Notes (Signed)
? ?  Subjective:  ? ? Patient ID: Mary Snyder, female    DOB: 07/20/1961, 61 y.o.   MRN: [id number] ? ?HPI 61 year old Female seen by interactive audio and video telecommunications due to the Coronavirus pandemic.  Patient is agreeable to visit in this format today.  She is identified using 2 identifiers as Mary Eaker. Snyder, a patient in this practice.  She is at her home and I am at the office. ? ?Patient says that she began to have runny nose and sinus pressure on Monday evening March 6.  She felt like she was coming down with a cold.  Symptoms got worse on Tuesday and Wednesday.  She took a home COVID test on Wednesday, March 8 and it was positive.  She has been taking over-the-counter decongestant.  Has developed productive cough.  Has myalgias and headache.  Has malaise and fatigue.  He is not short of breath.  No nausea. ? ?Patient has been vaccinated for COVID-19.  Last vaccine we have on file is February 2022. ? ?Patient has a history of impaired glucose tolerance treated with diet.  History of pure hypercholesterolemia treated with Crestor 10 mg daily.  History of vitamin D deficiency in 2021 which was corrected with high-dose vitamin D supplementation. ? ? ? ?Review of Systems denies nausea and vomiting.  No shortness of breath.  No chest pain. ? ?   ?Objective:  ? Physical Exam ? ?Seen virtually and looks fatigued.  Does not appear to be in respiratory distress and is not hoarse. ? ? ?   ?Assessment & Plan:  ?Acute COVID-19 virus infection ? ?Plan: Patient has been prescribed Paxlovid regular strength to take as directed.  Have also prescribed Tessalon Perles 100 mg to take up to 3 times daily as needed for cough. She is to rest at home and quarantine for minimum of 5 days.  She is to stay well-hydrated and is to walk some to prevent atelectasis.  Suggest monitoring oxygen level with pulse oximetry.  She will call if symptoms worsen.  We will check in with her tomorrow. ? ?Time spent with this visit  including chart review and interviewing patient as well as E scribing medication is 20 minutes ? ?

## 2022-01-29 NOTE — Patient Instructions (Signed)
Stay well-hydrated by consuming fluids.  Monitor pulse oximetry and walk some around the house to prevent atelectasis.  You have been prescribed Paxlovid regular strength to take as directed for acute COVID-19 virus infection.  Have also prescribed Tessalon Perles to take up to 3 times daily if needed for cough.  We will check in with you by telephone tomorrow. ?

## 2022-01-30 NOTE — Telephone Encounter (Signed)
Mary Snyder called today to say she is feeling better. She got her medication yesterday and started it. So far no one else in the family has gotten COVID from her. ?

## 2022-03-27 ENCOUNTER — Other Ambulatory Visit: Payer: 59

## 2022-03-27 DIAGNOSIS — R7302 Impaired glucose tolerance (oral): Secondary | ICD-10-CM

## 2022-03-27 DIAGNOSIS — E785 Hyperlipidemia, unspecified: Secondary | ICD-10-CM

## 2022-03-28 ENCOUNTER — Other Ambulatory Visit: Payer: Self-pay

## 2022-03-28 ENCOUNTER — Emergency Department (HOSPITAL_BASED_OUTPATIENT_CLINIC_OR_DEPARTMENT_OTHER)
Admission: EM | Admit: 2022-03-28 | Discharge: 2022-03-28 | Disposition: A | Discharge status: Home / Self Care | Payer: 59 | Attending: Emergency Medicine | Admitting: Emergency Medicine

## 2022-03-28 DIAGNOSIS — Y92410 Unspecified street and highway as the place of occurrence of the external cause: Secondary | ICD-10-CM | POA: Diagnosis not present

## 2022-03-28 DIAGNOSIS — S0990XA Unspecified injury of head, initial encounter: Secondary | ICD-10-CM | POA: Diagnosis not present

## 2022-03-28 DIAGNOSIS — R519 Headache, unspecified: Secondary | ICD-10-CM | POA: Diagnosis not present

## 2022-03-28 LAB — LIPID PANEL
Cholesterol: 203 mg/dL — ABNORMAL HIGH (ref <200)
HDL: 80 mg/dL (ref >=50)
LDL Cholesterol (Calc): 113 mg/dL (calc) — ABNORMAL HIGH
Non-HDL Cholesterol (Calc): 123 mg/dL (calc) (ref <130)
Total CHOL/HDL Ratio: 2.5 (calc) (ref <5.0)
Triglycerides: 35 mg/dL (ref <150)

## 2022-03-28 LAB — HEPATIC FUNCTION PANEL
AG Ratio: 2 (calc) (ref 1.0–2.5)
ALT: 21 U/L (ref 6–29)
AST: 21 U/L (ref 10–35)
Albumin: 4.5 g/dL (ref 3.6–5.1)
Alkaline phosphatase (APISO): 70 U/L (ref 37–153)
Bilirubin, Direct: 0.1 mg/dL (ref 0.0–0.2)
Globulin: 2.2 g/dL (calc) (ref 1.9–3.7)
Indirect Bilirubin: 0.3 mg/dL (calc) (ref 0.2–1.2)
Total Bilirubin: 0.4 mg/dL (ref 0.2–1.2)
Total Protein: 6.7 g/dL (ref 6.1–8.1)

## 2022-03-28 LAB — HEMOGLOBIN A1C
Hgb A1c MFr Bld: 5.8 % of total Hgb — ABNORMAL HIGH (ref <5.7)
Mean Plasma Glucose: 120 mg/dL
eAG (mmol/L): 6.6 mmol/L

## 2022-03-28 MED ORDER — IBUPROFEN 400 MG PO TABS
600.0000 mg | ORAL_TABLET | Freq: Once | ORAL | Status: AC
  Administered 2022-03-28: 600 mg via ORAL
  Filled 2022-03-28: qty 1
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
## 2022-06-19 ENCOUNTER — Other Ambulatory Visit: Payer: 59

## 2022-06-19 DIAGNOSIS — Z0000 Encounter for general adult medical examination without abnormal findings: Secondary | ICD-10-CM

## 2022-06-19 DIAGNOSIS — E7800 Pure hypercholesterolemia, unspecified: Secondary | ICD-10-CM

## 2022-06-19 DIAGNOSIS — R5383 Other fatigue: Secondary | ICD-10-CM

## 2022-06-19 DIAGNOSIS — E559 Vitamin D deficiency, unspecified: Secondary | ICD-10-CM

## 2022-06-19 DIAGNOSIS — R7302 Impaired glucose tolerance (oral): Secondary | ICD-10-CM

## 2022-06-25 ENCOUNTER — Other Ambulatory Visit: Payer: 59

## 2022-06-26 ENCOUNTER — Ambulatory Visit (INDEPENDENT_AMBULATORY_CARE_PROVIDER_SITE_OTHER): Payer: 59 | Admitting: Internal Medicine

## 2022-06-26 ENCOUNTER — Encounter: Payer: Self-pay | Admitting: Internal Medicine

## 2022-06-26 ENCOUNTER — Other Ambulatory Visit: Payer: 59

## 2022-06-26 VITALS — BP 114/76 | HR 54 | Temp 98.0°F | Ht 63.5 in | Wt 128.8 lb

## 2022-06-26 DIAGNOSIS — R82998 Other abnormal findings in urine: Secondary | ICD-10-CM | POA: Diagnosis not present

## 2022-06-26 DIAGNOSIS — Z0000 Encounter for general adult medical examination without abnormal findings: Secondary | ICD-10-CM

## 2022-06-26 DIAGNOSIS — E559 Vitamin D deficiency, unspecified: Secondary | ICD-10-CM | POA: Diagnosis not present

## 2022-06-26 DIAGNOSIS — R5383 Other fatigue: Secondary | ICD-10-CM | POA: Diagnosis not present

## 2022-06-26 DIAGNOSIS — Z780 Asymptomatic menopausal state: Secondary | ICD-10-CM

## 2022-06-26 DIAGNOSIS — E785 Hyperlipidemia, unspecified: Secondary | ICD-10-CM | POA: Diagnosis not present

## 2022-06-26 LAB — POCT URINALYSIS DIPSTICK
Bilirubin, UA: NEGATIVE
Blood, UA: NEGATIVE
Glucose, UA: NEGATIVE
Ketones, UA: NEGATIVE
Nitrite, UA: NEGATIVE
Protein, UA: NEGATIVE
Spec Grav, UA: 1.01 (ref 1.010–1.025)
Urobilinogen, UA: 0.2 E.U./dL
pH, UA: 7.5 (ref 5.0–8.0)

## 2022-06-26 NOTE — Patient Instructions (Addendum)
Bone density ordered and referral to orthopedist for right shoulder pain that has persisted for a few months. Vitamin D level added.  Endocrinology referral will be placed for weight loss consultation with new medications and management of impaired glucose tolerance.
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
## 2022-06-27 LAB — CBC WITH DIFFERENTIAL/PLATELET
Absolute Monocytes: 238 cells/uL (ref 200–950)
Basophils Absolute: 29 cells/uL (ref 0–200)
Basophils Relative: 0.7 %
Eosinophils Absolute: 41 cells/uL (ref 15–500)
Eosinophils Relative: 1 %
HCT: 39.4 % (ref 35.0–45.0)
Hemoglobin: 13.1 g/dL (ref 11.7–15.5)
Lymphs Abs: 1152 cells/uL (ref 850–3900)
MCH: 30.7 pg (ref 27.0–33.0)
MCHC: 33.2 g/dL (ref 32.0–36.0)
MCV: 92.3 fL (ref 80.0–100.0)
MPV: 10.1 fL (ref 7.5–12.5)
Monocytes Relative: 5.8 %
Neutro Abs: 2640 cells/uL (ref 1500–7800)
Neutrophils Relative %: 64.4 %
Platelets: 232 10*3/uL (ref 140–400)
RBC: 4.27 10*6/uL (ref 3.80–5.10)
RDW: 11.8 % (ref 11.0–15.0)
Total Lymphocyte: 28.1 %
WBC: 4.1 10*3/uL (ref 3.8–10.8)

## 2022-06-27 LAB — COMPLETE METABOLIC PANEL WITH GFR
AG Ratio: 2 (calc) (ref 1.0–2.5)
ALT: 19 U/L (ref 6–29)
AST: 20 U/L (ref 10–35)
Albumin: 4.5 g/dL (ref 3.6–5.1)
Alkaline phosphatase (APISO): 64 U/L (ref 37–153)
BUN: 15 mg/dL (ref 7–25)
CO2: 30 mmol/L (ref 20–32)
Calcium: 9.4 mg/dL (ref 8.6–10.4)
Chloride: 106 mmol/L (ref 98–110)
Creat: 0.53 mg/dL (ref 0.50–1.03)
Globulin: 2.3 g/dL (calc) (ref 1.9–3.7)
Glucose, Bld: 89 mg/dL (ref 65–99)
Potassium: 4.2 mmol/L (ref 3.5–5.3)
Sodium: 142 mmol/L (ref 135–146)
Total Bilirubin: 0.4 mg/dL (ref 0.2–1.2)
Total Protein: 6.8 g/dL (ref 6.1–8.1)
eGFR: 106 mL/min/{1.73_m2} (ref >=60)

## 2022-06-27 LAB — LIPID PANEL
Cholesterol: 201 mg/dL — ABNORMAL HIGH (ref <200)
HDL: 77 mg/dL (ref >=50)
LDL Cholesterol (Calc): 109 mg/dL (calc) — ABNORMAL HIGH
Non-HDL Cholesterol (Calc): 124 mg/dL (calc) (ref <130)
Total CHOL/HDL Ratio: 2.6 (calc) (ref <5.0)
Triglycerides: 63 mg/dL (ref <150)

## 2022-06-27 LAB — URINE CULTURE
MICRO NUMBER:: 13737519
Result:: NO GROWTH
SPECIMEN QUALITY:: ADEQUATE

## 2022-06-27 LAB — TSH: TSH: 2.9 mIU/L (ref 0.40–4.50)

## 2022-06-29 ENCOUNTER — Other Ambulatory Visit (HOSPITAL_COMMUNITY): Payer: Self-pay

## 2022-06-29 ENCOUNTER — Other Ambulatory Visit: Payer: Self-pay

## 2022-06-29 MED ORDER — ERGOCALCIFEROL 1.25 MG (50000 UT) PO CAPS
50000.0000 [IU] | ORAL_CAPSULE | ORAL | 3 refills | Status: DC
  Filled 2022-06-29: qty 12, 84d supply, fill #0
  Filled 2022-09-15: qty 12, 84d supply, fill #1
  Filled 2022-12-13 – 2022-12-22 (×2): qty 12, 84d supply, fill #2
  Filled 2023-03-15: qty 12, 84d supply, fill #3

## 2022-06-30 LAB — LIPOPROTEIN A (LPA): Lipoprotein (a): 30 nmol/L (ref <75)

## 2022-06-30 LAB — VITAMIN D 25 HYDROXY (VIT D DEFICIENCY, FRACTURES): Vit D, 25-Hydroxy: 30 ng/mL (ref 30–100)

## 2022-08-20 ENCOUNTER — Ambulatory Visit (HOSPITAL_BASED_OUTPATIENT_CLINIC_OR_DEPARTMENT_OTHER)
Admission: RE | Admit: 2022-08-20 | Discharge: 2022-08-20 | Disposition: A | Discharge status: Home / Self Care | Payer: 59 | Source: Ambulatory Visit | Attending: Internal Medicine | Admitting: Internal Medicine

## 2022-08-20 DIAGNOSIS — Z780 Asymptomatic menopausal state: Secondary | ICD-10-CM | POA: Insufficient documentation

## 2022-08-20 DIAGNOSIS — M8589 Other specified disorders of bone density and structure, multiple sites: Secondary | ICD-10-CM | POA: Diagnosis not present

## 2022-08-20 DIAGNOSIS — Z13820 Encounter for screening for osteoporosis: Secondary | ICD-10-CM | POA: Insufficient documentation

## 2022-08-21 DIAGNOSIS — S4351XA Sprain of right acromioclavicular joint, initial encounter: Secondary | ICD-10-CM | POA: Diagnosis not present

## 2022-09-15 ENCOUNTER — Other Ambulatory Visit (HOSPITAL_COMMUNITY): Payer: Self-pay

## 2022-09-17 ENCOUNTER — Ambulatory Visit (INDEPENDENT_AMBULATORY_CARE_PROVIDER_SITE_OTHER): Payer: 59 | Admitting: Obstetrics & Gynecology

## 2022-09-17 ENCOUNTER — Encounter (HOSPITAL_BASED_OUTPATIENT_CLINIC_OR_DEPARTMENT_OTHER): Payer: Self-pay | Admitting: Obstetrics & Gynecology

## 2022-09-17 VITALS — BP 113/45 | HR 58 | Ht 64.0 in | Wt 126.8 lb

## 2022-09-17 DIAGNOSIS — Z780 Asymptomatic menopausal state: Secondary | ICD-10-CM

## 2022-09-17 DIAGNOSIS — Z01419 Encounter for gynecological examination (general) (routine) without abnormal findings: Secondary | ICD-10-CM | POA: Diagnosis not present

## 2022-09-17 DIAGNOSIS — Z1231 Encounter for screening mammogram for malignant neoplasm of breast: Secondary | ICD-10-CM | POA: Diagnosis not present

## 2022-09-17 NOTE — Patient Instructions (Signed)
OsCal or Caltrate '600mg'$  daily

## 2022-09-17 NOTE — Progress Notes (Signed)
61 y.o. G2P2 Married White or Caucasian female here for annual exam.  Doing well.  Denies vaginal bleeding.    Had a BMD in September.  She has osteopenia.  Discussed exercise, calcium and Vit D intake.   No LMP recorded. Patient is postmenopausal.            Health Maintenance: Pap:  08/2021 neg with neg HR HPV History of abnormal Pap:  no MMG:  10/12/2022 Colonoscopy:  2015, follow up 10 years BMD:   07/2022 Screening Labs: done with Dr. Renold Genta   reports that she has never smoked. She has never used smokeless tobacco. She reports current alcohol use. She reports that she does not use drugs.  Past Medical History:  Diagnosis Date   Hyperlipidemia     Past Surgical History:  Procedure Laterality Date   BREAST REDUCTION SURGERY  2005    Current Outpatient Medications  Medication Sig Dispense Refill   ergocalciferol (VITAMIN D2) 1.25 MG (50000 UT) capsule Take 1 capsule (50,000 Units total) by mouth once a week. 12 capsule 3   rosuvastatin (CRESTOR) 10 MG tablet Take 1 tablet (10 mg total) by mouth daily. 90 tablet 1   No current facility-administered medications for this visit.    Family History  Problem Relation Age of Onset   Breast cancer Mother    Arthritis Mother    Hypertension Mother    Hypertension Father    Heart attack Father    Heart attack Brother    Kidney Stones Brother     ROS: Constitutional: negative Genitourinary:negative  Exam:   BP (!) 113/45 (Patient Position: Sitting, Cuff Size: Normal)   Pulse (!) 58   Ht '5\' 4"'$  (1.626 m) Comment: Reported  Wt 126 lb 12.8 oz (57.5 kg)   BMI 21.77 kg/m   Height: '5\' 4"'$  (162.6 cm) (Reported)  General appearance: alert, cooperative and appears stated age Head: Normocephalic, without obvious abnormality, atraumatic Neck: no adenopathy, supple, symmetrical, trachea midline and thyroid normal to inspection and palpation Lungs: clear to auscultation bilaterally Breasts: normal appearance, no masses or  tenderness Heart: regular rate and rhythm Abdomen: soft, non-tender; bowel sounds normal; no masses,  no organomegaly Extremities: extremities normal, atraumatic, no cyanosis or edema Skin: Skin color, texture, turgor normal. No rashes or lesions Lymph nodes: Cervical, supraclavicular, and axillary nodes normal. No abnormal inguinal nodes palpated Neurologic: Grossly normal   Pelvic: External genitalia:  no lesions              Urethra:  normal appearing urethra with no masses, tenderness or lesions              Bartholins and Skenes: normal                 Vagina: normal appearing vagina with normal color and no discharge, no lesions              Cervix: no lesions              Pap taken: No. Bimanual Exam:  Uterus:  normal size, contour, position, consistency, mobility, non-tender              Adnexa: normal adnexa and no mass, fullness, tenderness               Rectovaginal: Confirms               Anus:  normal sphincter tone, no lesions  Chaperone, Octaviano Batty, CMA, was present for exam.  Assessment/Plan: 1.  Well woman exam with routine gynecological exam - Pap smear neg 2022 with neg HR HPV - Mammogram 09/2022.  Order for next MMG will be placed - Colonoscopy 2015, follow up 10 years - Bone mineral density done 07/2022 - lab work done with Dr. Renold Genta - vaccines reviewed/updated  2. Encounter for screening mammogram for malignant neoplasm of breast - MM 3D SCREEN BREAST BILATERAL; Future  3. Postmenopausal

## 2022-12-14 ENCOUNTER — Encounter (HOSPITAL_COMMUNITY): Payer: Self-pay

## 2022-12-14 ENCOUNTER — Other Ambulatory Visit (HOSPITAL_COMMUNITY): Payer: Self-pay

## 2022-12-16 ENCOUNTER — Other Ambulatory Visit: Payer: Self-pay

## 2022-12-22 ENCOUNTER — Other Ambulatory Visit: Payer: Self-pay

## 2022-12-22 ENCOUNTER — Other Ambulatory Visit (HOSPITAL_COMMUNITY): Payer: Self-pay

## 2023-01-01 ENCOUNTER — Other Ambulatory Visit (HOSPITAL_COMMUNITY): Payer: Self-pay

## 2023-01-01 MED ORDER — TRAMADOL HCL 50 MG PO TABS
50.0000 mg | ORAL_TABLET | ORAL | 0 refills | Status: DC | PRN
  Filled 2023-01-01: qty 6, 1d supply, fill #0

## 2023-01-01 MED ORDER — AMOXICILLIN 500 MG PO CAPS
500.0000 mg | ORAL_CAPSULE | Freq: Three times a day (TID) | ORAL | 0 refills | Status: DC
  Filled 2023-01-01: qty 15, 5d supply, fill #0

## 2023-01-01 MED ORDER — CHLORHEXIDINE GLUCONATE 0.12 % MT SOLN
15.0000 mL | Freq: Two times a day (BID) | OROMUCOSAL | 0 refills | Status: DC
  Filled 2023-01-01: qty 473, 16d supply, fill #0

## 2023-03-15 ENCOUNTER — Other Ambulatory Visit: Payer: Self-pay | Admitting: Internal Medicine

## 2023-03-15 ENCOUNTER — Other Ambulatory Visit (HOSPITAL_COMMUNITY): Payer: Self-pay

## 2023-03-15 ENCOUNTER — Other Ambulatory Visit: Payer: Self-pay

## 2023-03-15 MED ORDER — ROSUVASTATIN CALCIUM 10 MG PO TABS
10.0000 mg | ORAL_TABLET | Freq: Every day | ORAL | 1 refills | Status: AC
  Filled 2023-03-15: qty 90, 90d supply, fill #0
  Filled 2023-06-15: qty 90, 90d supply, fill #1

## 2023-04-17 IMAGING — MG MM DIGITAL SCREENING BILAT W/ TOMO AND CAD
8 series · 8 of 24 positions shown · non-contrast
Comparison: None.

CLINICAL DATA: Screening.

EXAM:
DIGITAL SCREENING BILATERAL MAMMOGRAM WITH TOMOSYNTHESIS AND CAD
TECHNIQUE: Bilateral screening digital craniocaudal and mediolateral oblique
mammograms were obtained. Bilateral screening digital breast
tomosynthesis was performed. The images were evaluated with
computer-aided detection.

[L CC synth-2D]
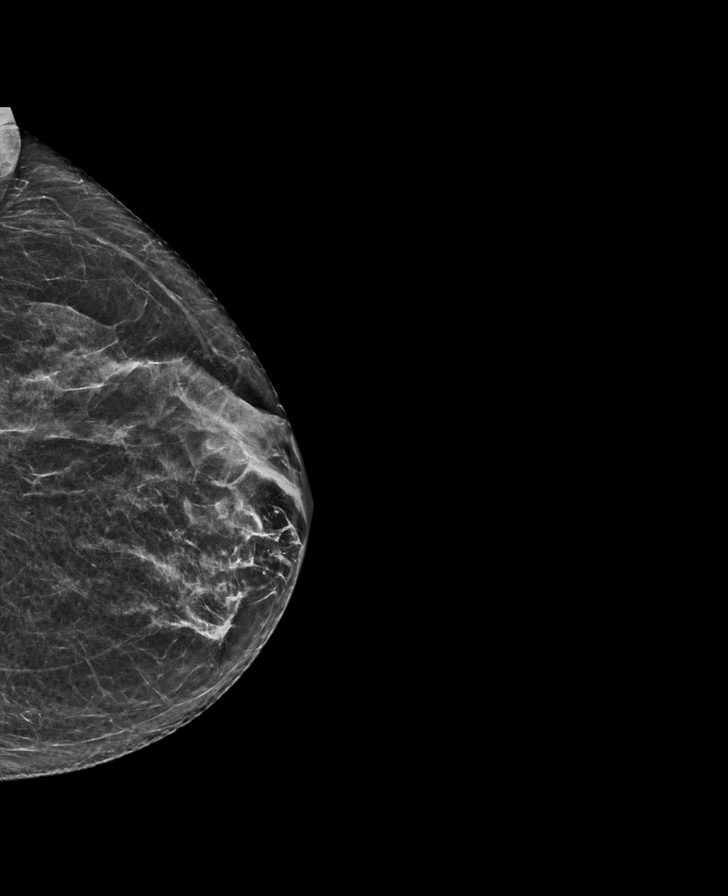

[R CC synth-2D]
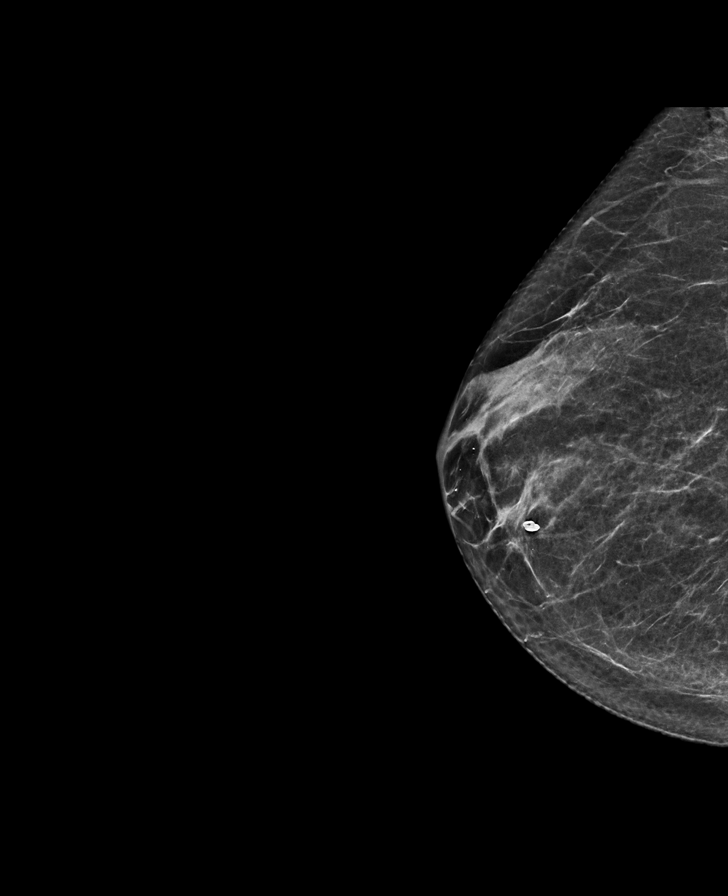

[R MLO synth-2D]
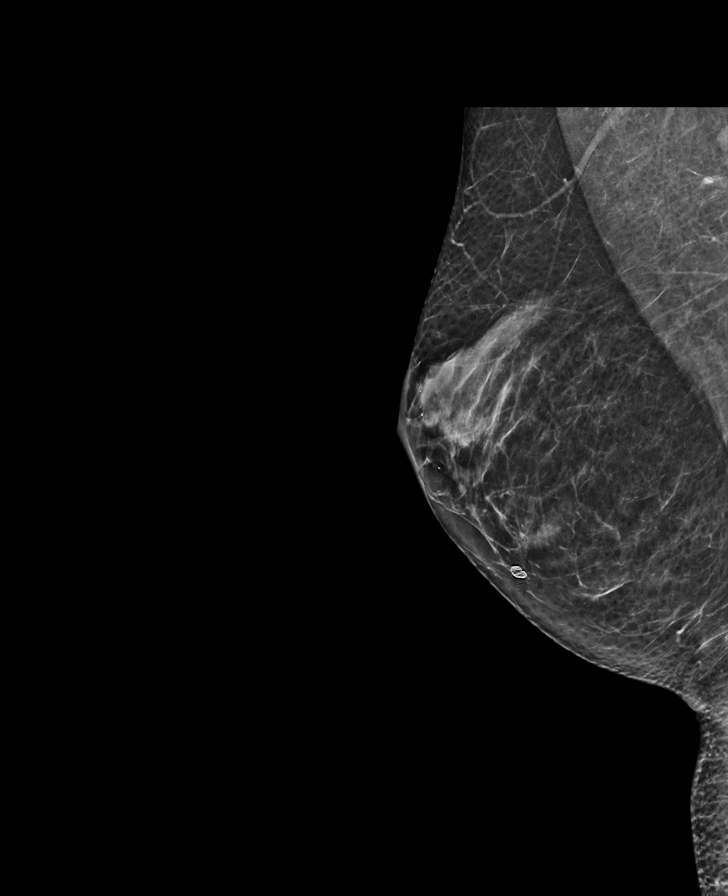

[L MLO synth-2D]
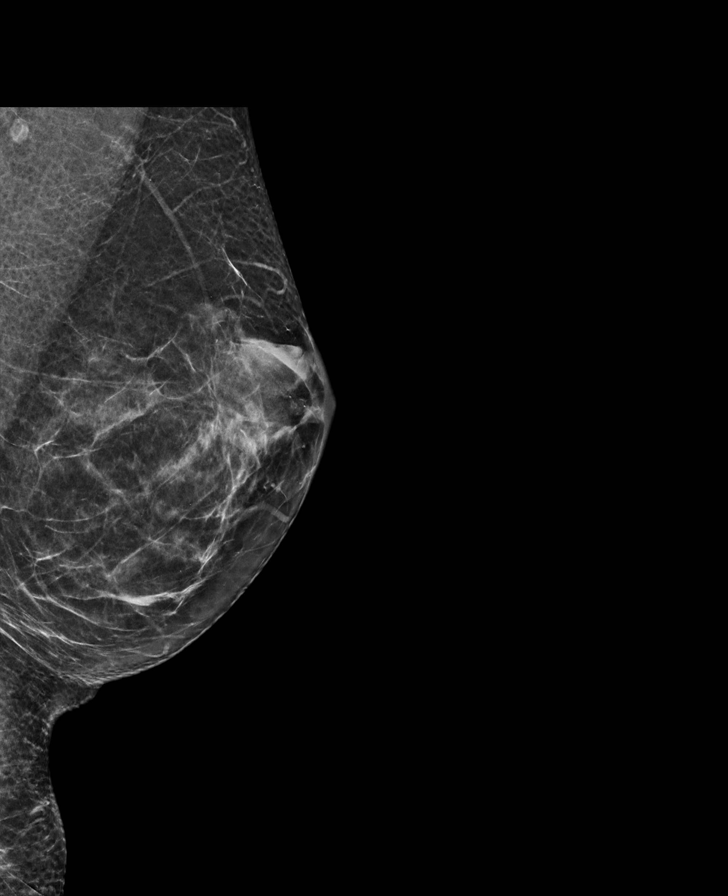

[R CC tomo · tomo slice 32/63.0]
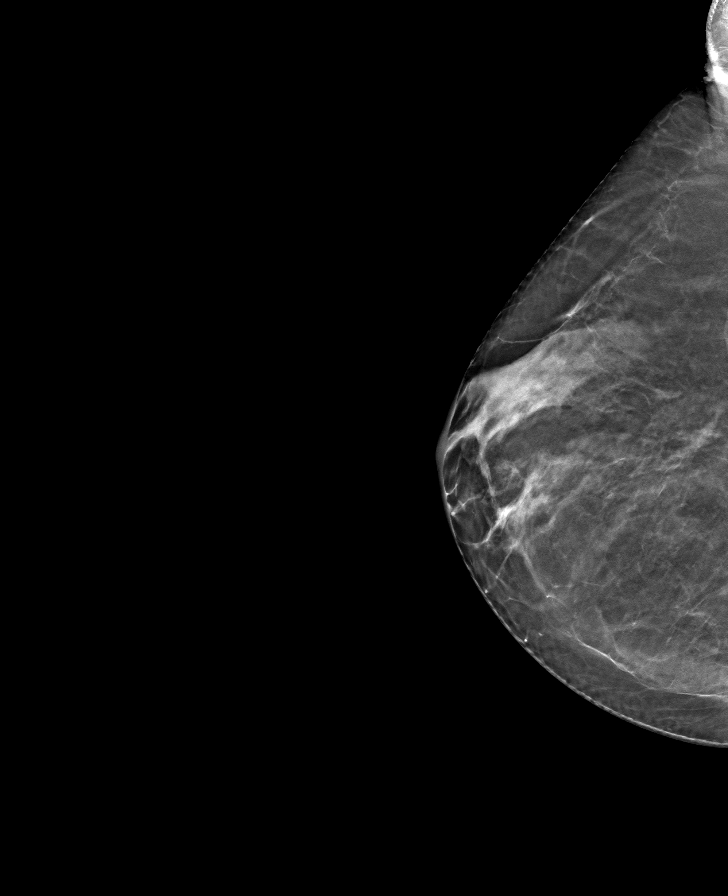

[L MLO tomo · tomo slice 33/64.0]
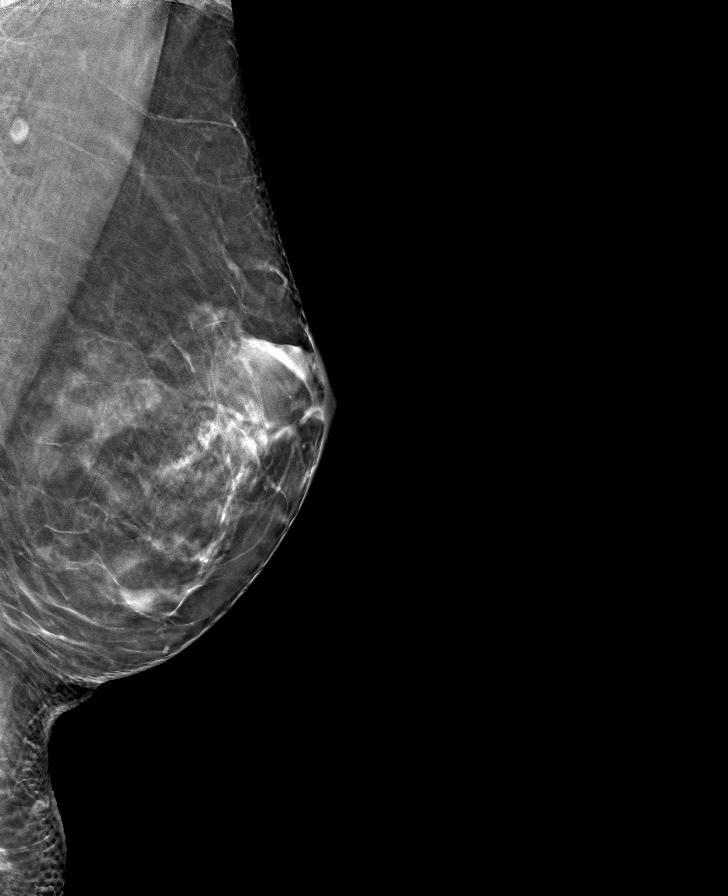

[R MLO tomo · tomo slice 33/65.0]
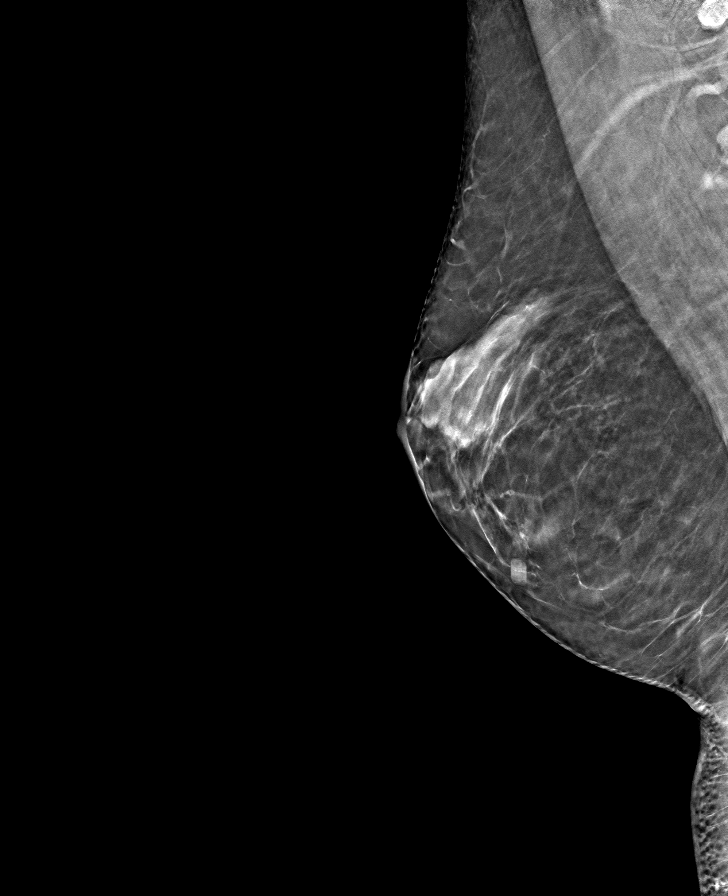

[L CC tomo · tomo slice 32/63.0]
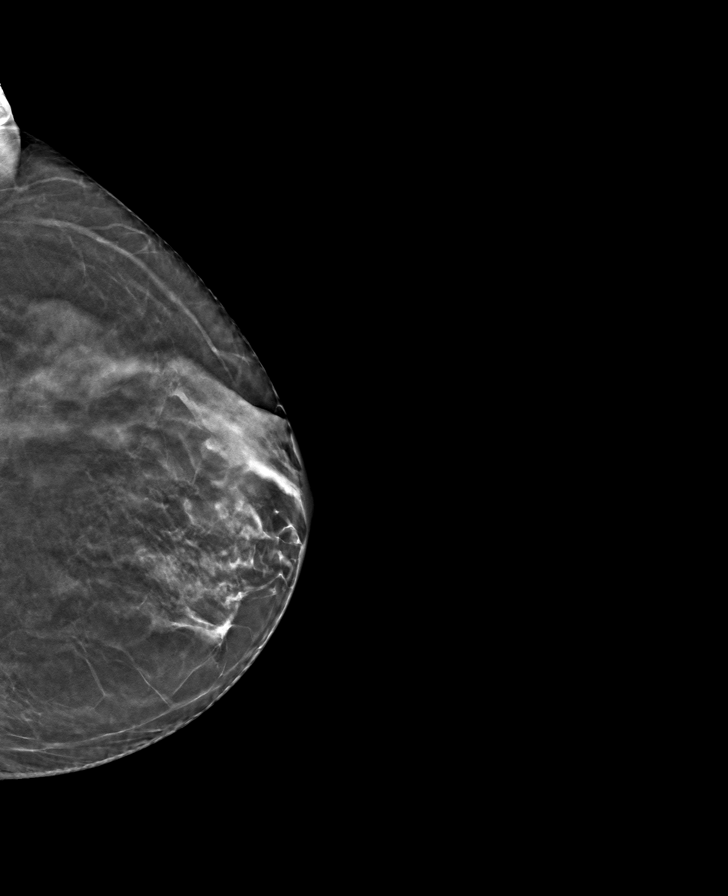

[8 of 24 positions shown; findings below may reference images not displayed]

ACR Breast Density Category c: The breast tissue is heterogeneously
dense, which may obscure small masses
FINDINGS: There are no findings suspicious for malignancy.
IMPRESSION: No mammographic evidence of malignancy. A result letter of this
screening mammogram will be mailed directly to the patient.

RECOMMENDATION:
Screening mammogram in one year. (Code:C8-T-HNK)

BI-RADS CATEGORY  1: Negative.

## 2023-06-15 ENCOUNTER — Other Ambulatory Visit (HOSPITAL_COMMUNITY): Payer: Self-pay

## 2023-06-16 ENCOUNTER — Other Ambulatory Visit (HOSPITAL_COMMUNITY): Payer: Self-pay

## 2023-06-16 ENCOUNTER — Ambulatory Visit: Payer: 59 | Admitting: Internal Medicine

## 2023-06-16 ENCOUNTER — Encounter: Payer: Self-pay | Admitting: Internal Medicine

## 2023-06-16 VITALS — Temp 97.6°F

## 2023-06-16 DIAGNOSIS — W57XXXA Bitten or stung by nonvenomous insect and other nonvenomous arthropods, initial encounter: Secondary | ICD-10-CM | POA: Diagnosis not present

## 2023-06-16 DIAGNOSIS — S80862A Insect bite (nonvenomous), left lower leg, initial encounter: Secondary | ICD-10-CM

## 2023-06-16 DIAGNOSIS — Z8639 Personal history of other endocrine, nutritional and metabolic disease: Secondary | ICD-10-CM | POA: Insufficient documentation

## 2023-06-16 MED ORDER — TRIAMCINOLONE ACETONIDE 0.1 % EX CREA
1.0000 | TOPICAL_CREAM | Freq: Three times a day (TID) | CUTANEOUS | 0 refills | Status: DC
  Filled 2023-06-16: qty 30, 10d supply, fill #0

## 2023-06-16 MED ORDER — DOXYCYCLINE HYCLATE 100 MG PO TABS
200.0000 mg | ORAL_TABLET | ORAL | 0 refills | Status: DC
  Filled 2023-06-16: qty 2, 1d supply, fill #0

## 2023-06-16 NOTE — Progress Notes (Signed)
   Subjective:    Patient ID: Mary Snyder, female    DOB: [birth year], 62 y.o.   MRN: [id number]  HPI Mary Snyder presents today having found a tick attached to her lower leg after being out in the yard watering some plants.  This is her first encounter since living here in [place] with a tick bite.  This was concerning to her.  She was able to collect fatigue in a plastic bag and brought it to the office.  She thinks the tick is not been attached for very long-probably 12 hours or even less.  She has no symptoms of RMSF or Lyme disease.  No rash.  No fever, headache, chills, nausea or vomiting.  She will be leaving soon for vacation in [place].  She has a history of impaired glucose tolerance and vitamin D deficiency.  Her general health is excellent.  Review of Systems see above     Objective:   Physical Exam Temperature 97.6 degrees by ear thermometer  Currently asymptomatic.  Fatigue has been removed and currently is not attached to her left lower ankle.  No evidence of secondary infection with the bite.      Assessment & Plan:  Tick bite left lower leg  Plan: Except for treatment would be doxycycline 100 mg (# 2 tablets) to take 2 tablets at 1 time due to tick exposure.  Patient is agreeable.  May apply Bactroban or Neosporin ointment twice daily to tick bite if so desired.  No evidence of local secondary bacterial infection.  Her tetanus immunization is up-to-date.

## 2023-06-16 NOTE — Patient Instructions (Addendum)
Recommend to doxycycline 100 mg tablets at 1 time as a preventative for Lyme disease.  Tick was not attached very long I do not think.  May treat bite topically with Bactroban or Neosporin ointment twice daily if so desired.  Bite site will likely itch some until healed.  It was a pleasure to see you today.  Please have a wonderful vacation.

## 2023-06-17 ENCOUNTER — Other Ambulatory Visit: Payer: Commercial Managed Care - PPO

## 2023-06-17 DIAGNOSIS — E559 Vitamin D deficiency, unspecified: Secondary | ICD-10-CM

## 2023-06-17 DIAGNOSIS — Z0000 Encounter for general adult medical examination without abnormal findings: Secondary | ICD-10-CM

## 2023-06-17 DIAGNOSIS — R7302 Impaired glucose tolerance (oral): Secondary | ICD-10-CM | POA: Diagnosis not present

## 2023-06-17 DIAGNOSIS — E785 Hyperlipidemia, unspecified: Secondary | ICD-10-CM | POA: Diagnosis not present

## 2023-06-17 DIAGNOSIS — Z1329 Encounter for screening for other suspected endocrine disorder: Secondary | ICD-10-CM | POA: Diagnosis not present

## 2023-06-17 LAB — CBC WITH DIFFERENTIAL/PLATELET
Absolute Monocytes: 197 cells/uL — ABNORMAL LOW (ref 200–950)
Basophils Absolute: 51 cells/uL (ref 0–200)
Basophils Relative: 1.5 %
Eosinophils Absolute: 41 cells/uL (ref 15–500)
Eosinophils Relative: 1.2 %
HCT: 40 % (ref 35.0–45.0)
Hemoglobin: 13.1 g/dL (ref 11.7–15.5)
Lymphs Abs: 1142 cells/uL (ref 850–3900)
MCH: 30.7 pg (ref 27.0–33.0)
MCHC: 32.8 g/dL (ref 32.0–36.0)
MCV: 93.7 fL (ref 80.0–100.0)
MPV: 9.9 fL (ref 7.5–12.5)
Monocytes Relative: 5.8 %
Neutro Abs: 1969 cells/uL (ref 1500–7800)
Neutrophils Relative %: 57.9 %
Platelets: 239 10*3/uL (ref 140–400)
RBC: 4.27 10*6/uL (ref 3.80–5.10)
RDW: 11.8 % (ref 11.0–15.0)
Total Lymphocyte: 33.6 %
WBC: 3.4 10*3/uL — ABNORMAL LOW (ref 3.8–10.8)

## 2023-06-18 LAB — HEMOGLOBIN A1C: Hgb A1c MFr Bld: 6.3 % of total Hgb — ABNORMAL HIGH (ref <5.7)

## 2023-06-18 LAB — COMPLETE METABOLIC PANEL WITH GFR

## 2023-06-25 ENCOUNTER — Other Ambulatory Visit: Payer: Commercial Managed Care - PPO
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
## 2023-07-02 ENCOUNTER — Ambulatory Visit (INDEPENDENT_AMBULATORY_CARE_PROVIDER_SITE_OTHER): Payer: 59 | Admitting: Internal Medicine

## 2023-07-02 ENCOUNTER — Encounter: Payer: Self-pay | Admitting: Internal Medicine

## 2023-07-02 VITALS — BP 100/70 | HR 62 | Ht 64.0 in | Wt 131.0 lb

## 2023-07-02 DIAGNOSIS — Z136 Encounter for screening for cardiovascular disorders: Secondary | ICD-10-CM

## 2023-07-02 DIAGNOSIS — Z0000 Encounter for general adult medical examination without abnormal findings: Secondary | ICD-10-CM | POA: Diagnosis not present

## 2023-07-02 DIAGNOSIS — E785 Hyperlipidemia, unspecified: Secondary | ICD-10-CM

## 2023-07-02 DIAGNOSIS — R7302 Impaired glucose tolerance (oral): Secondary | ICD-10-CM | POA: Diagnosis not present

## 2023-07-02 DIAGNOSIS — E559 Vitamin D deficiency, unspecified: Secondary | ICD-10-CM | POA: Diagnosis not present

## 2023-07-02 LAB — POCT URINALYSIS DIP (CLINITEK)
Bilirubin, UA: NEGATIVE
Blood, UA: NEGATIVE
Glucose, UA: NEGATIVE mg/dL
Ketones, POC UA: NEGATIVE mg/dL
Leukocytes, UA: NEGATIVE
Nitrite, UA: NEGATIVE
POC PROTEIN,UA: NEGATIVE
Spec Grav, UA: 1.01 (ref 1.010–1.025)
Urobilinogen, UA: 0.2 E.U./dL
pH, UA: 6.5 (ref 5.0–8.0)

## 2023-07-06 ENCOUNTER — Telehealth: Payer: Self-pay | Admitting: Internal Medicine

## 2023-07-06 NOTE — Telephone Encounter (Signed)
LVM to CB to schedule lab appointment for A1C in 3 months November no OV

## 2023-07-08 ENCOUNTER — Other Ambulatory Visit: Payer: Self-pay | Admitting: Internal Medicine

## 2023-07-08 ENCOUNTER — Other Ambulatory Visit (HOSPITAL_COMMUNITY): Payer: Self-pay

## 2023-07-08 MED ORDER — VITAMIN D (ERGOCALCIFEROL) 1.25 MG (50000 UNIT) PO CAPS
50000.0000 [IU] | ORAL_CAPSULE | ORAL | 3 refills | Status: DC
  Filled 2023-07-08: qty 12, 84d supply, fill #0

## 2023-07-08 NOTE — Telephone Encounter (Signed)
Last refill 06/29/22 #12 + 3 refills

## 2023-07-08 NOTE — Telephone Encounter (Signed)
Called back and scheduled

## 2023-07-09 ENCOUNTER — Other Ambulatory Visit: Payer: Self-pay

## 2023-07-09 ENCOUNTER — Encounter: Payer: Self-pay | Admitting: Pharmacist

## 2023-07-14 ENCOUNTER — Other Ambulatory Visit: Payer: Self-pay

## 2023-07-23 ENCOUNTER — Encounter: Payer: Self-pay | Admitting: Internal Medicine

## 2023-07-23 NOTE — Patient Instructions (Signed)
It was a pleasure to see you today. Coronary calcium scoring ordered. Please call to schedule . Let's recheck Hgb AIC in 3 months. Lipid panel is normal.Please have  annual mammogram in November.

## 2023-08-05 ENCOUNTER — Other Ambulatory Visit (HOSPITAL_BASED_OUTPATIENT_CLINIC_OR_DEPARTMENT_OTHER): Payer: 59

## 2023-09-01 ENCOUNTER — Ambulatory Visit (HOSPITAL_BASED_OUTPATIENT_CLINIC_OR_DEPARTMENT_OTHER)
Admission: RE | Admit: 2023-09-01 | Discharge: 2023-09-01 | Disposition: A | Discharge status: Home / Self Care | Payer: 59 | Source: Ambulatory Visit | Attending: Internal Medicine | Admitting: Internal Medicine

## 2023-09-01 DIAGNOSIS — Z136 Encounter for screening for cardiovascular disorders: Secondary | ICD-10-CM | POA: Insufficient documentation

## 2023-10-07 ENCOUNTER — Other Ambulatory Visit: Payer: 59

## 2023-10-14 ENCOUNTER — Other Ambulatory Visit: Payer: 59

## 2023-10-14 DIAGNOSIS — R7302 Impaired glucose tolerance (oral): Secondary | ICD-10-CM | POA: Diagnosis not present

## 2023-10-15 LAB — HEMOGLOBIN A1C
Hgb A1c MFr Bld: 6.1 %{Hb} — ABNORMAL HIGH (ref <5.7)
Mean Plasma Glucose: 128 mg/dL
eAG (mmol/L): 7.1 mmol/L

## 2024-07-06 ENCOUNTER — Other Ambulatory Visit: Payer: 59

## 2024-07-07 ENCOUNTER — Encounter: Payer: 59 | Admitting: Internal Medicine

## 2024-08-06 DIAGNOSIS — H524 Presbyopia: Secondary | ICD-10-CM | POA: Diagnosis not present

## 2024-08-14 ENCOUNTER — Encounter: Admitting: Internal Medicine

## 2024-08-23 ENCOUNTER — Other Ambulatory Visit: Payer: Self-pay | Admitting: Internal Medicine

## 2024-08-24 ENCOUNTER — Other Ambulatory Visit: Payer: Self-pay

## 2024-08-24 ENCOUNTER — Other Ambulatory Visit

## 2024-08-24 ENCOUNTER — Other Ambulatory Visit (HOSPITAL_COMMUNITY): Payer: Self-pay

## 2024-08-24 ENCOUNTER — Encounter: Payer: Self-pay | Admitting: Pharmacist

## 2024-08-24 DIAGNOSIS — R7302 Impaired glucose tolerance (oral): Secondary | ICD-10-CM

## 2024-08-24 DIAGNOSIS — Z1329 Encounter for screening for other suspected endocrine disorder: Secondary | ICD-10-CM

## 2024-08-24 DIAGNOSIS — E785 Hyperlipidemia, unspecified: Secondary | ICD-10-CM

## 2024-08-24 DIAGNOSIS — Z0000 Encounter for general adult medical examination without abnormal findings: Secondary | ICD-10-CM

## 2024-08-24 MED ORDER — VITAMIN D (ERGOCALCIFEROL) 1.25 MG (50000 UNIT) PO CAPS
50000.0000 [IU] | ORAL_CAPSULE | ORAL | 3 refills | Status: DC
  Filled 2024-08-24: qty 12, 84d supply, fill #0

## 2024-08-25 LAB — COMPREHENSIVE METABOLIC PANEL WITH GFR
AG Ratio: 2 (calc) (ref 1.0–2.5)
ALT: 22 U/L (ref 6–29)
AST: 22 U/L (ref 10–35)
Albumin: 4.7 g/dL (ref 3.6–5.1)
Alkaline phosphatase (APISO): 75 U/L (ref 37–153)
BUN: 18 mg/dL (ref 7–25)
CO2: 30 mmol/L (ref 20–32)
Calcium: 9.4 mg/dL (ref 8.6–10.4)
Chloride: 103 mmol/L (ref 98–110)
Creat: 0.57 mg/dL (ref 0.50–1.05)
Globulin: 2.3 g/dL (ref 1.9–3.7)
Glucose, Bld: 101 mg/dL — ABNORMAL HIGH (ref 65–99)
Potassium: 4.3 mmol/L (ref 3.5–5.3)
Sodium: 139 mmol/L (ref 135–146)
Total Bilirubin: 0.5 mg/dL (ref 0.2–1.2)
Total Protein: 7 g/dL (ref 6.1–8.1)
eGFR: 103 mL/min/1.73m2 (ref >=60)

## 2024-08-25 LAB — CBC WITH DIFFERENTIAL/PLATELET
Absolute Lymphocytes: 1398 {cells}/uL (ref 850–3900)
Absolute Monocytes: 230 {cells}/uL (ref 200–950)
Basophils Absolute: 41 {cells}/uL (ref 0–200)
Basophils Relative: 0.9 %
Eosinophils Absolute: 51 {cells}/uL (ref 15–500)
Eosinophils Relative: 1.1 %
HCT: 42.9 % (ref 35.0–45.0)
Hemoglobin: 13.7 g/dL (ref 11.7–15.5)
MCH: 30.9 pg (ref 27.0–33.0)
MCHC: 31.9 g/dL — ABNORMAL LOW (ref 32.0–36.0)
MCV: 96.8 fL (ref 80.0–100.0)
MPV: 10.2 fL (ref 7.5–12.5)
Monocytes Relative: 5 %
Neutro Abs: 2880 {cells}/uL (ref 1500–7800)
Neutrophils Relative %: 62.6 %
Platelets: 240 Thousand/uL (ref 140–400)
RBC: 4.43 Million/uL (ref 3.80–5.10)
RDW: 12.3 % (ref 11.0–15.0)
Total Lymphocyte: 30.4 %
WBC: 4.6 Thousand/uL (ref 3.8–10.8)

## 2024-08-25 LAB — LIPID PANEL
Cholesterol: 220 mg/dL — ABNORMAL HIGH (ref <200)
HDL: 88 mg/dL (ref >=50)
LDL Cholesterol (Calc): 118 mg/dL — ABNORMAL HIGH
Non-HDL Cholesterol (Calc): 132 mg/dL — ABNORMAL HIGH (ref <130)
Total CHOL/HDL Ratio: 2.5 (calc) (ref <5.0)
Triglycerides: 53 mg/dL (ref <150)

## 2024-08-25 LAB — HEMOGLOBIN A1C
Hgb A1c MFr Bld: 6.2 % — ABNORMAL HIGH (ref <5.7)
Mean Plasma Glucose: 131 mg/dL
eAG (mmol/L): 7.3 mmol/L

## 2024-08-25 LAB — TSH: TSH: 2.67 m[IU]/L (ref 0.40–4.50)

## 2024-08-25 LAB — MICROALBUMIN / CREATININE URINE RATIO
Creatinine, Urine: 114 mg/dL (ref 20–275)
Microalb Creat Ratio: 13 mg/g{creat} (ref <30)
Microalb, Ur: 1.5 mg/dL

## 2024-08-28 ENCOUNTER — Encounter (HOSPITAL_BASED_OUTPATIENT_CLINIC_OR_DEPARTMENT_OTHER): Payer: Self-pay | Admitting: Obstetrics & Gynecology

## 2024-08-28 ENCOUNTER — Encounter: Admitting: Internal Medicine

## 2024-08-28 ENCOUNTER — Ambulatory Visit (INDEPENDENT_AMBULATORY_CARE_PROVIDER_SITE_OTHER): Admitting: Obstetrics & Gynecology

## 2024-08-28 ENCOUNTER — Other Ambulatory Visit (HOSPITAL_COMMUNITY)
Admission: RE | Admit: 2024-08-28 | Discharge: 2024-08-28 | Disposition: A | Discharge status: Home / Self Care | Source: Ambulatory Visit | Attending: Obstetrics & Gynecology | Admitting: Obstetrics & Gynecology

## 2024-08-28 VITALS — BP 104/61 | HR 52 | Wt 131.6 lb

## 2024-08-28 DIAGNOSIS — Z1231 Encounter for screening mammogram for malignant neoplasm of breast: Secondary | ICD-10-CM

## 2024-08-28 DIAGNOSIS — E785 Hyperlipidemia, unspecified: Secondary | ICD-10-CM | POA: Diagnosis not present

## 2024-08-28 DIAGNOSIS — Z1151 Encounter for screening for human papillomavirus (HPV): Secondary | ICD-10-CM | POA: Diagnosis not present

## 2024-08-28 DIAGNOSIS — M8580 Other specified disorders of bone density and structure, unspecified site: Secondary | ICD-10-CM

## 2024-08-28 DIAGNOSIS — Z124 Encounter for screening for malignant neoplasm of cervix: Secondary | ICD-10-CM | POA: Insufficient documentation

## 2024-08-28 DIAGNOSIS — Z780 Asymptomatic menopausal state: Secondary | ICD-10-CM | POA: Diagnosis not present

## 2024-08-28 DIAGNOSIS — Z01419 Encounter for gynecological examination (general) (routine) without abnormal findings: Secondary | ICD-10-CM | POA: Diagnosis not present

## 2024-08-28 DIAGNOSIS — Z1211 Encounter for screening for malignant neoplasm of colon: Secondary | ICD-10-CM

## 2024-08-28 NOTE — Progress Notes (Unsigned)
 ANNUAL EXAM Patient name: Mary Snyder MRN [id number]  Date of birth: 1961-03-09 Chief Complaint:   Gynecologic Exam (Pt reports no concerns or complaints today.)  History of Present Illness:   Mary Snyder is a 63 y.o. G2P2 Caucasian female being seen today for a routine annual exam.  Doing well.  Denies vaginal bleeding.  Moving to [place] in two weeks.  Needs to get some tests updated before leaving.  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    PCP:  Dr. Perri  No LMP recorded. Patient is postmenopausal.   Last pap 09/10/2021. Results were: NILM w/ HRHPV negative. H/O abnormal pap: no Last mammogram: 09/25/2021. Results were: normal. Family h/o breast cancer: yes mother. Last colonoscopy: 10/23/2014. Results were: abnormal 1 polyp. Family h/o colorectal cancer: no Dexa:  07/2022     08/28/2024    2:36 PM 09/17/2022    4:25 PM 09/10/2021   11:27 AM 06/20/2021    3:20 PM 12/05/2019    3:21 PM  Depression screen PHQ 2/9  Decreased Interest 0 0 0 0 0  Down, Depressed, Hopeless 0 0 0 0 0  PHQ - 2 Score 0 0 0 0 0      Review of Systems:   Pertinent items are noted in HPI Denies any new bladder or bowel changes.  Denies pelvic pain. Pertinent History Reviewed:  Reviewed past medical,surgical, social and family history.  Reviewed problem list, medications and allergies. Physical Assessment:   Vitals:   08/28/24 1433  BP: 104/61  Pulse: (!) 52  SpO2: 98%  Weight: 131 lb 9.6 oz (59.7 kg)  Body mass index is 22.59 kg/m.        Physical Examination:   General appearance - well appearing, and in no distress  Mental status - alert, oriented to person, place, and time  Psych:  She has a normal mood and affect  Skin - warm and dry, normal color, no suspicious lesions noted  Chest - effort normal, all lung fields clear to auscultation bilaterally  Heart - normal rate and regular rhythm  Neck:  midline trachea, no thyromegaly or  nodules  Breasts - breasts appear normal, no suspicious masses, no skin or nipple changes or  axillary nodes  Abdomen - soft, nontender, nondistended, no masses or organomegaly  Pelvic - VULVA: normal appearing vulva with no masses, tenderness or lesions   VAGINA: normal appearing vagina with normal color and discharge, no lesions   CERVIX: normal appearing cervix without discharge or lesions, no CMT  Thin prep pap is updated today with HR HPV  UTERUS: uterus is felt to be normal size, shape, consistency and nontender   ADNEXA: No adnexal masses or tenderness noted.  Rectal - normal rectal, good sphincter tone, no masses felt.   Extremities:  No swelling or varicosities noted  Chaperone present for exam  No results found for this or any previous visit (from the past 24 hours).  Assessment & Plan:  1. Well woman exam with routine gynecological exam (Primary) - Pap smear with HR PV obtained today - Mammogram order placed - Colonoscopy referral placed.  She knows this may not get done prior to her move.  Advised her to call the office for possible cancellation - Bone mineral density ordered - lab work done with PCP, Dr. Jaclyn - vaccines reviewed/updated  2. Cervical cancer screening - Cytology - PAP( [place])  3. Encounter for screening mammogram for malignant neoplasm of breast -  MM 3D SCREENING MAMMOGRAM BILATERAL BREAST; Future  4. Osteopenia after menopause - DG Bone Density; Future  5. Hyperlipidemia, unspecified hyperlipidemia type  6. Postmenopausal    No orders of the defined types were placed in this encounter.   Meds: No orders of the defined types were placed in this encounter.   Follow-up: No follow-ups on file.  Ronal GORMAN Pinal, MD 08/28/2024 3:18 PM

## 2024-08-28 NOTE — Patient Instructions (Addendum)
 Caltrate or Oscal 600mg  with Vit D  Call [phone] to schedule an appointment at [facility] radiology.

## 2024-08-29 ENCOUNTER — Other Ambulatory Visit (HOSPITAL_COMMUNITY): Payer: Self-pay

## 2024-08-29 ENCOUNTER — Other Ambulatory Visit: Payer: Self-pay

## 2024-08-30 ENCOUNTER — Ambulatory Visit
Admission: RE | Admit: 2024-08-30 | Discharge: 2024-08-30 | Disposition: A | Discharge status: Home / Self Care | Source: Ambulatory Visit | Attending: Obstetrics & Gynecology | Admitting: Obstetrics & Gynecology

## 2024-08-30 ENCOUNTER — Ambulatory Visit

## 2024-08-30 DIAGNOSIS — Z1231 Encounter for screening mammogram for malignant neoplasm of breast: Secondary | ICD-10-CM | POA: Diagnosis not present

## 2024-08-30 LAB — CYTOLOGY - PAP
Comment: NEGATIVE
Diagnosis: NEGATIVE
High risk HPV: NEGATIVE

## 2024-08-31 ENCOUNTER — Ambulatory Visit (HOSPITAL_BASED_OUTPATIENT_CLINIC_OR_DEPARTMENT_OTHER)
Admission: RE | Admit: 2024-08-31 | Discharge: 2024-08-31 | Disposition: A | Discharge status: Home / Self Care | Source: Ambulatory Visit | Attending: Obstetrics & Gynecology | Admitting: Obstetrics & Gynecology

## 2024-08-31 DIAGNOSIS — M8580 Other specified disorders of bone density and structure, unspecified site: Secondary | ICD-10-CM | POA: Insufficient documentation

## 2024-08-31 DIAGNOSIS — M8589 Other specified disorders of bone density and structure, multiple sites: Secondary | ICD-10-CM | POA: Diagnosis not present

## 2024-08-31 DIAGNOSIS — Z780 Asymptomatic menopausal state: Secondary | ICD-10-CM | POA: Insufficient documentation

## 2024-08-31 NOTE — Progress Notes (Incomplete)
 Annual Comprehensive Physical Exam    Patient Care Team: Perri Ronal PARAS, MD as PCP - General (Internal Medicine)  Visit Date: 08/31/24   No chief complaint on file.  Subjective:  Patient: Mary Snyder, Female DOB: 18-Jun-1961, 63 y.o. MRN: [id number] There were no vitals filed for this visit. Mary Snyder is a 63 y.o. Female who presents today for her Annual Comprehensive Physical Exam . Patient has Hyperlipidemia; Postmenopausal; and History of vitamin D  deficiency on their problem list.  History of Hyperlipidemia treated with rosuvastatin  10 mg daily. 08/24/2024   History of Vitamin D  deficiency Treated with Vitamin D2 50,000 units weekly.   History of Impaired glucose tolerance, 08/24/2024 HgbA1c 6.2.   Labs 08/24/2024 MCHC 31.9, Blood glucose 101, HgbA1c 6.2, CHOL 220, LDL 118,   08/30/2024 Mammogram pending   09/01/2023 CT cardiac score: 6   08/31/2024 Bone density Pending    Colonoscopy A small flat Polyp, measuring 6 mm in size, was found in the ascending colon. Resected and retrieved, Otherwise the colon appeared normal. Repeat in 10 year.  Health Maintenance  Topic Date Due   HIV Screening  Never done   Hepatitis C Screening  Never done   Pneumococcal Vaccine: 50+ Years (1 of 1 - PCV) Never done   Zoster Vaccines- Shingrix (1 of 2) 07/16/2012   Mammogram  09/26/2023   COVID-19 Vaccine (4 - 2025-26 season) 07/24/2024   Colonoscopy  10/23/2024   DTaP/Tdap/Td (2 - Td or Tdap) 12/17/2025   Cervical Cancer Screening (HPV/Pap Cotest)  08/28/2029   Influenza Vaccine  Completed   Hepatitis B Vaccines 19-59 Average Risk  Aged Out   HPV VACCINES  Aged Out   Meningococcal B Vaccine  Aged Out    {Man or Woman:32389}  Vaccine Counseling: Due for {Vaccines:32291::Influenza}; UTD on {Vaccines:32291::Influenza}  ROS Objective:  Vitals: body mass index is unknown because there is no height or weight on file.There were no vitals filed for this visit. Physical  Exam  Current Outpatient Medications  Medication Instructions   rosuvastatin  (CRESTOR ) 10 mg, Oral, Daily   triamcinolone  cream (KENALOG ) 0.1 % 1 Application, Topical, 3 times daily   Vitamin D  (Ergocalciferol ) (DRISDOL ) 50,000 Units, Oral, Weekly   Past Medical History:  Diagnosis Date   Hyperlipidemia    Medical/Surgical History Narrative:  Allergic/Intolerant to: No Known Allergies *** - ***  *** - ***  *** - ***  *** - ***  *** - ***  *** - ***  *** - ***  *** - *** Other - Hx of: *** ; Surghx of: *** Past Surgical History:  Procedure Laterality Date   BREAST REDUCTION SURGERY  2005   Family History  Problem Relation Age of Onset   Breast cancer Mother    Arthritis Mother    Hypertension Mother    Hypertension Father    Heart attack Father    Heart attack Brother    Kidney Stones Brother    Family History Narrative: {ELFamHX:31110} Social History   Social History Narrative   Not on file   Most Recent Health Risks Assessment:   Most Recent Social Determinants of Health (Including Hx of Tobacco, Alcohol, and Drug Use) SDOH Screenings   Depression (PHQ2-9): Low Risk  (08/28/2024)  Tobacco Use: Low Risk  (08/28/2024)   Social History   Tobacco Use   Smoking status: Never   Smokeless tobacco: Never  Vaping Use   Vaping status: Never Used  Substance Use Topics   Alcohol use: Yes  Comment: occasionally   Drug use: Never   Most Recent Functional Status Assessment:     No data to display         Most Recent Fall Risk Assessment:    08/28/2024    2:36 PM  Fall Risk   Falls in the past year? 1  Number falls in past yr: 0  Injury with Fall? 0   Most Recent Anxiety/Depression Screenings:    08/28/2024    2:36 PM 09/17/2022    4:25 PM  PHQ 2/9 Scores  PHQ - 2 Score 0 0       No data to display         Most Recent Cognitive Screening:     No data to display         Most Recent Vision/Hearing Screenings:No results  found. Results:  Studies Obtained And Personally Reviewed By Me: Diabetic Foot Exam - Simple   No data filed     {Imaging, colonoscopy, mammogram, bone density scan, echocardiogram, heart cath, stress test, CT calcium  score, etc.:32292}  Labs:  CBC w/ Differential Lab Results  Component Value Date   WBC 4.6 08/24/2024   RBC 4.43 08/24/2024   HGB 13.7 08/24/2024   HCT 42.9 08/24/2024   PLT 240 08/24/2024   MCV 96.8 08/24/2024   MCH 30.9 08/24/2024   MCHC 31.9 (L) 08/24/2024   RDW 12.3 08/24/2024   MPV 10.2 08/24/2024   LYMPHSABS 1,142 06/17/2023   MONOABS 0.3 05/02/2020   BASOSABS 41 08/24/2024    Comprehensive Metabolic Panel Lab Results  Component Value Date   NA 139 08/24/2024   K 4.3 08/24/2024   CL 103 08/24/2024   CO2 30 08/24/2024   GLUCOSE 101 (H) 08/24/2024   BUN 18 08/24/2024   CREATININE 0.57 08/24/2024   CALCIUM  9.4 08/24/2024   PROT 7.0 08/24/2024   AST 22 08/24/2024   ALT 22 08/24/2024   BILITOT 0.5 08/24/2024   EGFR 103 08/24/2024   GFRNONAA 103 02/20/2021   Lipid Panel  Lab Results  Component Value Date   CHOL 220 (H) 08/24/2024   HDL 88 08/24/2024   LDLCALC 118 (H) 08/24/2024   TRIG 53 08/24/2024   A1c Lab Results  Component Value Date   HGBA1C 6.2 (H) 08/24/2024    TSH Lab Results  Component Value Date   TSH 2.67 08/24/2024    Assessment & Plan:  No orders of the defined types were placed in this encounter.  No orders of the defined types were placed in this encounter.  Other Labs Reviewed today:    No follow-ups on file.   Annual Comprehensive Physical Exam done today including the all of the following: Reviewed patient's Family Medical History Reviewed patient's SDOH and reviewed tobacco, alcohol, and drug use.  Reviewed and updated list of patient's medical providers Assessment of cognitive impairment was done Assessed patient's functional ability Established a written schedule for health screening services Health  Risk Assessent Completed and Reviewed  Discussed health benefits of physical activity, and encouraged her to engage in regular exercise appropriate for her age and condition.    I,Makayla C Reid,acting as a scribe for [name], MD.,have documented all relevant documentation on the behalf of [name], MD,as directed by  [name], MD while in the presence of [name], MD.   I, [name], MD, have reviewed all documentation for and agree with the above Annual Wellness Visit documentation.  [name], MD  Internal Medicine 09/01/2024

## 2024-09-01 ENCOUNTER — Other Ambulatory Visit (HOSPITAL_COMMUNITY): Payer: Self-pay

## 2024-09-01 ENCOUNTER — Ambulatory Visit (INDEPENDENT_AMBULATORY_CARE_PROVIDER_SITE_OTHER): Admitting: Internal Medicine

## 2024-09-01 ENCOUNTER — Encounter: Payer: Self-pay | Admitting: Internal Medicine

## 2024-09-01 VITALS — BP 98/60 | HR 64 | Ht 63.75 in | Wt 130.2 lb

## 2024-09-01 DIAGNOSIS — J029 Acute pharyngitis, unspecified: Secondary | ICD-10-CM | POA: Diagnosis not present

## 2024-09-01 DIAGNOSIS — E7800 Pure hypercholesterolemia, unspecified: Secondary | ICD-10-CM | POA: Diagnosis not present

## 2024-09-01 DIAGNOSIS — Z0000 Encounter for general adult medical examination without abnormal findings: Secondary | ICD-10-CM

## 2024-09-01 DIAGNOSIS — R7302 Impaired glucose tolerance (oral): Secondary | ICD-10-CM

## 2024-09-01 DIAGNOSIS — E559 Vitamin D deficiency, unspecified: Secondary | ICD-10-CM

## 2024-09-01 LAB — POCT URINALYSIS DIP (CLINITEK)
Bilirubin, UA: NEGATIVE
Blood, UA: NEGATIVE
Glucose, UA: NEGATIVE mg/dL
Ketones, POC UA: NEGATIVE mg/dL
Leukocytes, UA: NEGATIVE
Nitrite, UA: NEGATIVE
POC PROTEIN,UA: 30 — AB
Spec Grav, UA: 1.02 (ref 1.010–1.025)
Urobilinogen, UA: 0.2 U/dL
pH, UA: 6.5 (ref 5.0–8.0)

## 2024-09-01 LAB — POCT RAPID STREP A (OFFICE): Rapid Strep A Screen: NEGATIVE

## 2024-09-01 MED ORDER — ERGOCALCIFEROL 1.25 MG (50000 UT) PO CAPS
50000.0000 [IU] | ORAL_CAPSULE | ORAL | 3 refills | Status: AC
  Filled 2024-09-01: qty 12, 84d supply, fill #0

## 2024-09-01 NOTE — Progress Notes (Signed)
 Annual Comprehensive Physical Exam    Patient Care Team: Perri Ronal PARAS, MD as PCP - General (Internal Medicine)  Visit Date: 09/01/24   Chief Complaint  Patient presents with   Annual Exam    Woke up with this morning with a sore throat and it is hard to swallow.    Subjective:  Patient: Mary Snyder, Female DOB: 1961/08/01, 63 y.o. MRN: [id number] Vitals:   09/01/24 1001  BP: 98/60   Mary Snyder is a 63 y.o. Female who presents today for her Annual Comprehensive Physical Exam . Patient has Hyperlipidemia; Postmenopausal; and History of vitamin D  deficiency on their problem list.  Woke up with a sore throat this morning  History of Hyperlipidemia treated with Rosuvastatin  10 mg daily. 08/24/2024 Lipid Panel with Cholesterol elevated at 220 from 176, and LDL elevated at 118 from 84. 09/01/2023 CT cardiac score: 6   History of Vitamin-D Deficiency treated with Vitamin D2 50,000 units weekly.   History of Impaired Glucose Tolerance, 08/24/2024 HgbA1c 6.2, elevated from 6.1 in 09/2023.   Labs 08/24/2024: MCHC 31.9, Blood glucose 101, HgbA1c 6.2, CHOL 220, LDL 118.  08/30/2024 Mammogram pending   08/31/2024 Bone density Pending    Colonoscopy A small flat Polyp, measuring 6 mm in size, was found in the ascending colon. Resected and retrieved, Otherwise the colon appeared normal. Repeat in 10 year.     Vaccine Counseling: Due for   GYN deferred  Moving to [place] [facility] [place]  ROS Objective:  Vitals: body mass index is 22.53 kg/m. Today's Vitals   09/01/24 1001  BP: 98/60  Pulse: 64  SpO2: 97%  Weight: 130 lb 4 oz (59.1 kg)  Height: 5' 3.75 (1.619 m)   Physical Exam  Current Outpatient Medications  Medication Instructions   rosuvastatin  (CRESTOR ) 10 mg, Oral, Daily   triamcinolone  cream (KENALOG ) 0.1 % 1 Application, Topical, 3 times daily   Vitamin D  (Ergocalciferol ) (DRISDOL ) 50,000 Units, Oral, Weekly   Past Medical History:  Diagnosis Date    Hyperlipidemia    Medical/Surgical History Narrative:  Allergic/Intolerant to: No Known Allergies  Past Surgical History:  Procedure Laterality Date   BREAST REDUCTION SURGERY  2005   Family History  Problem Relation Age of Onset   Breast cancer Mother    Arthritis Mother    Hypertension Mother    Hypertension Father    Heart attack Father    Heart attack Brother    Kidney Stones Brother    Social History   Social History Narrative   Not on file   Most Recent Health Risks Assessment:   Most Recent Social Determinants of Health (Including Hx of Tobacco, Alcohol, and Drug Use) SDOH Screenings   Depression (PHQ2-9): Low Risk  (08/28/2024)  Tobacco Use: Low Risk  (08/28/2024)   Social History   Tobacco Use   Smoking status: Never   Smokeless tobacco: Never  Vaping Use   Vaping status: Never Used  Substance Use Topics   Alcohol use: Yes    Comment: occasionally   Drug use: Never   Most Recent Functional Status Assessment:     No data to display         Most Recent Fall Risk Assessment:    08/28/2024    2:36 PM  Fall Risk   Falls in the past year? 1  Number falls in past yr: 0  Injury with Fall? 0   Most Recent Anxiety/Depression Screenings:    08/28/2024    2:36 PM  09/17/2022    4:25 PM  PHQ 2/9 Scores  PHQ - 2 Score 0 0       No data to display         Most Recent Cognitive Screening:     No data to display         Most Recent Vision/Hearing Screenings:No results found. Results:  Studies Obtained And Personally Reviewed By Me: Diabetic Foot Exam - Simple   No data filed       Labs:  CBC w/ Differential Lab Results  Component Value Date   WBC 4.6 08/24/2024   RBC 4.43 08/24/2024   HGB 13.7 08/24/2024   HCT 42.9 08/24/2024   PLT 240 08/24/2024   MCV 96.8 08/24/2024   MCH 30.9 08/24/2024   MCHC 31.9 (L) 08/24/2024   RDW 12.3 08/24/2024   MPV 10.2 08/24/2024   LYMPHSABS 1,142 06/17/2023   MONOABS 0.3 05/02/2020   BASOSABS  41 08/24/2024    Comprehensive Metabolic Panel Lab Results  Component Value Date   NA 139 08/24/2024   K 4.3 08/24/2024   CL 103 08/24/2024   CO2 30 08/24/2024   GLUCOSE 101 (H) 08/24/2024   BUN 18 08/24/2024   CREATININE 0.57 08/24/2024   CALCIUM  9.4 08/24/2024   PROT 7.0 08/24/2024   AST 22 08/24/2024   ALT 22 08/24/2024   BILITOT 0.5 08/24/2024   EGFR 103 08/24/2024   GFRNONAA 103 02/20/2021   Lipid Panel  Lab Results  Component Value Date   CHOL 220 (H) 08/24/2024   HDL 88 08/24/2024   LDLCALC 118 (H) 08/24/2024   TRIG 53 08/24/2024   A1c Lab Results  Component Value Date   HGBA1C 6.2 (H) 08/24/2024    TSH Lab Results  Component Value Date   TSH 2.67 08/24/2024    Assessment & Plan:   Vitamin D  deficiency treated with Drisdol  50,000 units weekly Impaired glucose tolerance- does not want to be on medication for this. Continue diet and exercise  regimen Vaccines discussed- pt to consider Pure hypercholesterolemia- pt prefers to treat this with diet and exercise. Healthcare maintenance- colonoscopy due. Last colonoscopy was 2025. Pt is [place] out of [place] in the near furutr and will consider this after relocating. Nonstrep phatyngitis- she will treat conservatively- does not wish to take antibiotics         Annual Comprehensive Physical Exam done today including the all of the following: Reviewed patient's Family Medical History Reviewed patient's SDOH and reviewed tobacco, alcohol, and drug use.  Reviewed and updated list of patient's medical providers Assessment of cognitive impairment was done Assessed patient's functional ability Established a written schedule for health screening services Health Risk Assessent Completed and Reviewed  Discussed health benefits of physical activity, and encouraged her to engage in regular exercise appropriate for her age and condition.    I, Emily Lagle ,acting as a scribe for [name], MD.,have documented  all relevant documentation on the behalf of [name], MD,as directed by  [name], MD while in the presence of [name], MD.   I, [name], MD, have reviewed all documentation for and agree with the above Annual Wellness Visit documentation.  [name], MD Internal Medicine 09/01/2024
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
## 2024-09-05 ENCOUNTER — Encounter: Admitting: Internal Medicine

## 2024-09-08 ENCOUNTER — Encounter: Admitting: Internal Medicine

## 2024-09-08 ENCOUNTER — Ambulatory Visit (HOSPITAL_BASED_OUTPATIENT_CLINIC_OR_DEPARTMENT_OTHER): Payer: Self-pay | Admitting: Obstetrics & Gynecology

## 2024-09-12 NOTE — Patient Instructions (Addendum)
 It has been a pleasure to see you for Primary Care Services. I wish you much luck and great success in your next position. Please let us  know if you need assistance accessing your records after your move.

## 2024-09-21 ENCOUNTER — Ambulatory Visit (HOSPITAL_BASED_OUTPATIENT_CLINIC_OR_DEPARTMENT_OTHER): Payer: 59 | Admitting: Obstetrics & Gynecology
